# Patient Record
Sex: Male | Born: 1960 | Race: Asian | Hispanic: No | Marital: Married | Smoking: Never smoker
Health system: Southern US, Community
[De-identification: ages and names within clinical notes are randomized; demographics above are authoritative.]

## PROBLEM LIST (undated history)

## (undated) DIAGNOSIS — Z923 Personal history of irradiation: Secondary | ICD-10-CM

## (undated) DIAGNOSIS — I10 Essential (primary) hypertension: Secondary | ICD-10-CM

## (undated) DIAGNOSIS — M87059 Idiopathic aseptic necrosis of unspecified femur: Secondary | ICD-10-CM

## (undated) DIAGNOSIS — N289 Disorder of kidney and ureter, unspecified: Secondary | ICD-10-CM

## (undated) DIAGNOSIS — C029 Malignant neoplasm of tongue, unspecified: Secondary | ICD-10-CM

## (undated) HISTORY — DX: Essential (primary) hypertension: I10

## (undated) HISTORY — PX: JOINT REPLACEMENT: SHX530

## (undated) HISTORY — PX: OTHER SURGICAL HISTORY: SHX169

## (undated) HISTORY — DX: Personal history of irradiation: Z92.3

## (undated) HISTORY — PX: HIP SURGERY: SHX245

## (undated) HISTORY — PX: TOTAL HIP ARTHROPLASTY: SHX124

---

## 1997-10-09 ENCOUNTER — Ambulatory Visit (HOSPITAL_COMMUNITY): Admission: RE | Admit: 1997-10-09 | Discharge: 1997-10-09 | Payer: Self-pay | Admitting: General Surgery

## 1997-12-08 ENCOUNTER — Ambulatory Visit (HOSPITAL_COMMUNITY): Admission: RE | Admit: 1997-12-08 | Discharge: 1997-12-08 | Payer: Self-pay | Admitting: General Surgery

## 1998-01-10 ENCOUNTER — Ambulatory Visit (HOSPITAL_COMMUNITY): Admission: RE | Admit: 1998-01-10 | Discharge: 1998-01-10 | Payer: Self-pay | Admitting: General Surgery

## 1998-11-05 HISTORY — PX: KIDNEY TRANSPLANT: SHX239

## 2000-09-17 ENCOUNTER — Encounter: Payer: Self-pay | Admitting: Nephrology

## 2000-09-17 ENCOUNTER — Emergency Department (HOSPITAL_COMMUNITY): Admission: EM | Admit: 2000-09-17 | Discharge: 2000-09-17 | Payer: Self-pay | Admitting: Emergency Medicine

## 2000-10-11 ENCOUNTER — Emergency Department (HOSPITAL_COMMUNITY): Admission: EM | Admit: 2000-10-11 | Discharge: 2000-10-11 | Payer: Self-pay | Admitting: Emergency Medicine

## 2000-10-11 ENCOUNTER — Encounter: Payer: Self-pay | Admitting: Emergency Medicine

## 2001-03-22 ENCOUNTER — Encounter: Payer: Self-pay | Admitting: Nephrology

## 2001-03-22 ENCOUNTER — Inpatient Hospital Stay (HOSPITAL_COMMUNITY): Admission: EM | Admit: 2001-03-22 | Discharge: 2001-03-23 | Payer: Self-pay | Admitting: Emergency Medicine

## 2001-04-06 ENCOUNTER — Encounter: Admission: RE | Admit: 2001-04-06 | Discharge: 2001-04-06 | Payer: Self-pay | Admitting: Nephrology

## 2001-04-06 ENCOUNTER — Encounter: Payer: Self-pay | Admitting: Nephrology

## 2001-04-22 ENCOUNTER — Encounter: Admission: RE | Admit: 2001-04-22 | Discharge: 2001-06-07 | Payer: Self-pay | Admitting: *Deleted

## 2002-02-02 ENCOUNTER — Encounter: Payer: Self-pay | Admitting: Orthopedic Surgery

## 2002-02-02 ENCOUNTER — Ambulatory Visit (HOSPITAL_COMMUNITY): Admission: RE | Admit: 2002-02-02 | Discharge: 2002-02-02 | Payer: Self-pay | Admitting: Orthopedic Surgery

## 2003-01-25 ENCOUNTER — Encounter: Payer: Self-pay | Admitting: Nephrology

## 2003-01-25 ENCOUNTER — Encounter: Admission: RE | Admit: 2003-01-25 | Discharge: 2003-01-25 | Payer: Self-pay | Admitting: Nephrology

## 2003-12-15 ENCOUNTER — Inpatient Hospital Stay (HOSPITAL_COMMUNITY): Admission: RE | Admit: 2003-12-15 | Discharge: 2003-12-18 | Payer: Self-pay | Admitting: Orthopedic Surgery

## 2004-09-04 ENCOUNTER — Encounter: Admission: RE | Admit: 2004-09-04 | Discharge: 2004-09-04 | Payer: Self-pay | Admitting: Nephrology

## 2004-12-03 ENCOUNTER — Inpatient Hospital Stay (HOSPITAL_COMMUNITY): Admission: RE | Admit: 2004-12-03 | Discharge: 2004-12-05 | Payer: Self-pay | Admitting: Orthopedic Surgery

## 2005-07-14 ENCOUNTER — Ambulatory Visit: Payer: Self-pay | Admitting: Internal Medicine

## 2005-07-18 ENCOUNTER — Ambulatory Visit: Payer: Self-pay | Admitting: Internal Medicine

## 2009-09-09 ENCOUNTER — Emergency Department (HOSPITAL_COMMUNITY): Admission: EM | Admit: 2009-09-09 | Discharge: 2009-09-09 | Payer: Self-pay | Admitting: Family Medicine

## 2010-02-21 ENCOUNTER — Emergency Department (HOSPITAL_COMMUNITY): Admission: EM | Admit: 2010-02-21 | Discharge: 2010-02-21 | Payer: Self-pay | Admitting: Family Medicine

## 2010-08-31 ENCOUNTER — Inpatient Hospital Stay (INDEPENDENT_AMBULATORY_CARE_PROVIDER_SITE_OTHER)
Admission: RE | Admit: 2010-08-31 | Discharge: 2010-08-31 | Disposition: A | Discharge status: Home / Self Care | Payer: 59 | Source: Ambulatory Visit | Attending: Family Medicine | Admitting: Family Medicine

## 2010-08-31 DIAGNOSIS — T783XXA Angioneurotic edema, initial encounter: Secondary | ICD-10-CM

## 2010-11-22 NOTE — Op Note (Signed)
NAMETEION, BALLIN                            ACCOUNT NO.:  [id number]   MEDICAL RECORD NO.:  [id number]                   PATIENT TYPE:  INP   LOCATION:  0002                                 FACILITY:  [facility]   PHYSICIAN:  Madlyn Frankel. Charlann Boxer, M.D.               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   POSTOPERATIVE DIAGNOSIS:  Left hip end-stage avascular necrosis with  collapse of femoral head, status post free vascularized fibula graft.   DESCRIPTION OF PROCEDURE:  1. Removal of hardware.  2. Conversion of previous hip surgery to total hip replacement.  271.32.   SURGEON:  Madlyn Frankel. Charlann Boxer, M.D.   COMPONENTS USED:  A DePuy hip system with a size 54 Pinnacle cup, a 54/36  neutral liner, and S-ROM 20 x 15 femoral stem plus 36+12 offset with a 36+0  ball, a 11F XXL sleeve.   ASSISTANT:  Clarene Reamer, P.A.-C.   ANESTHESIA:  General.   ESTIMATED BLOOD LOSS:  600 mL.   IV FLUIDS:  2200 mL.   DRAINS:  One.   COMPLICATIONS:  None apparent.   INDICATIONS FOR PROCEDURE:  Mr. Antonacci is a very pleasant 50 year old [place]  male who has a history of renal failure and subsequent renal transplant.  He, unfortunately, suffered avascular necrosis secondary to his medication.  He was seen and evaluated initially in [place] where he underwent bilateral  free vascular fibula grafts.  He has subsequently gone on to collapse of his  hips with persistent pain despite conservative measures.  We reviewed the  risks and benefits of the procedure and he consented for the above  procedure.   DESCRIPTION OF PROCEDURE:  The patient was brought to the operating theater.  Once adequate anesthesia and preoperative antibiotics, 1 g of Ancef, were  administered, the patient  was positioned in the right lateral decubitus  position, left side up.  Portions of the previous incision was utilized and  extended posteriorly for approach to the hip.  Iliotibial band and gluteus  maximus was incised in line with the incision.  Pins from the previous  surgery was identified and removed.  At this point a neck osteotomy was made  based off preoperative template and anatomical landmarks.  The femoral head  was removed and noted to be severely diseased.  The fibula strut allograft  present was identified.  Care was taken with the oscillating saw to prevent  fracture and make sure that bone cut had gone all the way through.  Following the neck osteotomy, exposure of the acetabulum was carried out.  The patient was noted to have significant contractions over the anterior  capsule.  A capsulotomy was performed.  The patient also had significant  anterior and posterior osteophytes.  With acetabulum exposure, reaming was  commenced with the 47 reamer.  It was carried to the medial wall.  Subsequent reaming was carried up to the 53 reamer with good purchase.  A 54  final Pinnacle cup was positioned with approximately 40 degrees of abduction  and 15 degrees of forward flexion.  This matched the position of his ischium  posteriorly.  Two cancellous bone screws were placed given the position it  was near anatomic.  Trial liner was placed and osteophytes were removed off  the anterior and posterior aspect of this pelvis.  This debrided further  capsule which helped free up the anterior aspect of the hip.   At this point attention was directed to femoral component.  The fibular  strut needed to be burred down with a high-speed bur.  This was carried out  down the shaft of the femur as well as laterally to prevent any varus  positioning on the component or any further complications or fractures.  Once this was carried out, preparation of the femoral canal was carried out  per protocol  for an S-ROM with a good purchase of cortical bone distally  with a 15 mm ring.  A 15.5 ring was passed down three-quarters of the way.  Proximal reaming was carried up to a 45F and then milling to a XXL.  Note  that the patient's head had collapsed in a significant amount of varus.  In  order to maintain or to maximize the length of the hip to a more anatomic  position, we based everything off the tip of the trochanter.  From this  standpoint, the sleeve ended up sitting up proud, about 2 mm which provided  [place] with our goals.  When the trial sleeve in place, trial reduction was  carried out initially with 36+8 stem, 20 x 15 with 10 degrees of  anteversion.  There seemed to be some impingement anteriorly.  Based on  this, we decided to try a 36+12 offset stem which provided a much more  clearance anterior.  Further debridement of capsular tissue was removed on  the anterior superior aspect of the pelvis.  With this in place, the hip was  stable with 36+ 0 ball.  The left lower extremity at this point seemed to be  matched to or a little longer than the right side which was discussed  preoperatively as the left lower extremity was shorter.  We also discussed  the fact that given the fact that both hips were affected and the right hip  will need to be done, we will be able to match his leg lengths with this  right hip replacement.  This was understood preoperatively.  The hip was  stable with internal rotation to 80 degrees, neutral abduction, 80 degrees  of flexion.  Hip was stable with excellent rotation and extension.  The hip  was stable and the sleeve positioned.  Note that the hip did adduct down to  the contralateral limb and sat in excellent rotative position without any  assistance.  At this point trial components were removed.  Final acetabular  shell hubcap was placed followed by the final alignment.  This was impacted without complication.  Final components with the femoral side  were brought  to the field with a 20 XXL sleeve was impacted into the level where the  trial was and the final 20-15-36+12 S-ROM stem was placed in  approximately  at 10 and 20 degrees of anteversion.  This amounted to an addition of 20-25  degrees of femoral anteversion, gave [place] a combined anteversion of 45  degrees.  A trial was carried out again with a 36+ 0 ball to assure  stability of the hip before final head was placed and the hip stability  matched the trial reduction with the same parameters.  The final 36+ 0 ball  was then impacted onto a __________ trunnion and the hip reduced.  The hip  was copiously irrigated with normal saline solution at this point.  The  posterior capsular tissue was reapproximated to itself as it was saved from  the short external rotators.  Hemovac drain was placed.  The iliotibial band  was reapproximated with #1Ethibond.  The gluteus maximus fascia with a  running #1 Vicryl.  The remainder of the wound was closed in layers with a  running 4-0 Monocryl.  The patient's hip was cleaned, dried and dressed  sterilely Steri-Strips, dressing, sponges and tape.  The patient was  transferred to the recovery room, extubated and in stable condition.                                               Madlyn Frankel Charlann Boxer, M.D.    MDO/MEDQ  D:  12/15/2003  T:  12/15/2003  Job:  [id number]

## 2010-11-22 NOTE — H&P (Signed)
[place]. [facility]  Patient:    [name], [name] Visit Number: [id number] MRN: [id number]          Service Type: EMS Location: [facility] [place] Attending Physician:  Nelia Shi Dictated by:   Pricilla Holm, M.D. Admit Date:  03/21/2001   CC:         [facility]   History and Physical  SERVICE:  Renal service.  PRIMARY CARE PHYSICIAN:  Dyke Maes, M.D.  CHIEF COMPLAINT:  Fever, nausea and vomiting.  HISTORY OF PRESENT ILLNESS:  The patient is a 50 year old male status post renal transplant in May 2000 secondary to end-stage renal disease who recently underwent vascular grafting on his right leg several weeks ago.  He was on Cipro x 10 days ago and developed a low grade fever about four days ago.  He went to [facility] for urine and lab work and was put on Cipro at that time.  Today, he was changed to Augmentin.  He reports nausea and vomiting x 2 today with a fever up to 104 and diarrhea x 1.  He reports increased weakness.  No sick contacts.  No chest pain, no shortness of breath. Positive headache.  No dysuria. No hematuria.  No melena.  He has been taking all of his medications as prescribed.  PAST MEDICAL HISTORY: 1. End-stage renal disease, status post renal transplantation on the right by    Dr. Cameron Ali at [facility] in May 2000.  The patient did have a vascular    necrosis associated with that transplantation.  The patient was on dialysis    or three years, originally hemodialysis and then peritoneal dialysis. 2. Hypertension. 3. Status post vascular grafting on the right by Dr. Dorothea Ogle.  MEDICATIONS: 1. Tylenol EX p.r.n. 2. Aspirin q.d. 3. Prednisone 2.5 mg q.d. 4. Altace 2.5 mg q.d. 5. CellCept 1 g b.i.d. 6. Gengraf 125 mg b.i.d. 7. Multivitamin q.d. 8. Citracal with D q.a.c.  ALLERGIES:  VANCOMYCIN - red mans syndrome.  FAMILY HISTORY:  No history of renal problems in family.  SOCIAL HISTORY:   Married.  Lives in [place] where he has been living for the past eight years.  He has a 10 year old son.  He is self employed.  No alcohol.  No tobacco.  REVIEW OF SYSTEMS:  Per HPI.  PHYSICAL EXAMINATION:  VITAL SIGNS:  Temperature 102.9, respirations 18, pulse 105, blood pressure 114/64.  Saturation 96% on room air.  GENERAL:  This is a well-developed, well-nourished pleasant 50 year old male in mild distress secondary to vomiting.  HEENT:  Normocephalic, atraumatic.  Pupils are equal, round and reactive to light and accommodation.  Extraocular movements intact bilaterally.  Nares patent without discharge.  Oropharynx pale with moist mucous membranes.  No tonsillar exudate or erythema.  NECK:  Supple without lymphadenopathy.  No JVD.  HEART:  Regular rate and rhythm without murmurs, rubs or gallops.  LUNGS:  Clear to auscultation bilaterally with good effort.  ABDOMEN:  Soft, nontender, nondistended.  Hyperactive bowel sounds. Well-healed scars.  Fullness in the right lower quadrant secondary to kidney transplant.  EXTREMITIES:  No cyanosis, clubbing or edema.  Warm to touch.  Two linear scars on right leg without evidence of erythema or discharge.  NEUROLOGICAL: Cranial nerves II-XII grossly intact.  Alert and oriented x 4.  LABORATORY DATA:  Blood cultures x 2 pending.  Sodium 133, potassium 4.4, chloride 104, bicarbonate 24, BUN 16, creatinine 1.5, glucose 115, calcium 8.6, total protein 7.1, albumin  3.0, AST 18, ALT 18.  Alkaline phosphatase 79, total bilirubin 1.2.  White blood cell count 13.7, hemoglobin 9.4, hematocrit 27.8.  Platelets 460,000.  MCV 86.9, ANC 11.5.  Urinalysis:  Specific gravity of 1.012, trace hemoglobin, nitrite negative, leukocyte esterase negative.  Microscopic reveals a few epithelial cells, 3-6 white blood cells, 3-6 red blood cells and a few bacteria.  ASSESSMENT AND PLAN: 1. [age]-year-old male status post renal transplant. Recent  vascular    rafting now with fever, nausea and vomiting.  Admit to renal service,    Dr. Lowell Guitar, attending, 5500. 2. Fever.  The patient has fever with an elevated white blood cell.  Will    obtain chest x-ray, blood cultures x 2, culture of urine despite being on    antibiotics.  Will send stool for culture and Clostridium difficile.    Renal ultrasound for hydronephrosis.  Will start broad spectrum    antibiotics specifically Tequin and Zosyn.  We will also check a CMV    titer. 3. Status post transplant.  Continue CellCept and Gengraf. 4. Fluids, electrolytes and nutrition.  Renal diet when able to    tolerate.  May need to give IV fluids p.r.n. secondary to nausea    and vomiting. 5. Hypertension.  Continue Altace. Dictated by:   Pricilla Holm, M.D. Attending Physician:  Nelia Shi DD:  03/22/01 TD:  03/22/01 Job: [id number]

## 2010-11-22 NOTE — H&P (Signed)
NAMEHUSAIN, COSTABILE                ACCOUNT NO.:  [id number]   MEDICAL RECORD NO.:  [id number]          PATIENT TYPE:  INP   LOCATION:  NA                           FACILITY:  [facility]   PHYSICIAN:  Madlyn Frankel. Charlann Boxer, M.D.  DATE OF BIRTH:  1961/04/10   DATE OF ADMISSION:  12/03/2004  DATE OF DISCHARGE:                                HISTORY & PHYSICAL   CHIEF COMPLAINT:  Right hip avascular necrosis, status  post prevascular  fibular grafting with collapse and failure.   HISTORY OF PRESENT ILLNESS:  Mr. Barrows is a very pleasant 50 year old  gentleman who is known to my practice after being referred from Dr. Primitivo Gauze of [facility] regarding his hips.  He has a  history of renal transplantation due to prednisone use, presumably present  in use of bilateral avascular necrosis.  He subsequently underwent free  vascularized fibular grafting at [facility].  These were initially done in 2002 on  the left and 2003 on the right.  He unfortunately went on to have failure of  these with femoral head collapse and degenerative joint changes, resulting  in excessive loss of motion, pain with activities.  In June of 2005 he  underwent a left hip surgery to convert previous hip surgery to a left total  hip replacement.  He has done extremely well with this.  During the  postoperative period we discussed with him when to perform this right hip  surgery.  At this point he is ready.  He has had no problems with fevers,  chills, night sweats.  No  problems with infections throughout this  postoperative recovery period.   PAST MEDICAL HISTORY:  1.  End-stage renal failure, status post cadaveric renal transplant.  2.  History of hypertension.  3.  History of hypercholesterolemia.  4.  Avascular necrosis, bilateral hips.   PAST SURGICAL HISTORY:  1.  Renal transplant.  2.  Bilateral prevascularized fibula grafting noted above.  3.  He is also status post left total hip replacement  performed in June of      2005.   MEDICATIONS:  1.  Prednisone 2.5 mg p.o. daily.  2.  CellCept 1000 mg p.o. b.i.d.  3.  Gengraf 100 mg p.o. b.i.d.  4.  Citrucel one p.o. b.i.d.  5.  Altace 2.5 mg b.i.d.  6.  Lipitor 10 mg p.o. daily.   ALLERGIES:  VANCOMYCIN causes redman.   SOCIAL HISTORY:  He denies any tobacco use, has occasional alcohol.  He is  married.   FAMILY HISTORY:  Hypertension.   REVIEW OF SYSTEMS:  Otherwise unremarkable.  He did have some recent onset  of some left-sided buttock discomfort in which he was concerned about his  hip prosthesis after some activity, but I felt this was more muscular  strain.  This was a recent office visit, but there were no abnormalities  present.   PHYSICAL EXAMINATION:  GENERAL:  Reveals a healthy male in no acute  distress.  In general he is awake, alert, oriented, and cooperative.  Well-  dressed and appropriate, eager to  proceed.  VITAL SIGNS:  His temperature was 98.2, pulse 68, blood pressure 144/80.  HEENT/NECK:  Completely normal with no palpable nodes.  He has no bruits  detected.  CHEST:  Clear to auscultation bilaterally with no rales or rhonchi.  HEART:  Has regular rate and rhythm, no murmur.  ABDOMEN:  Soft, nontender, with positive bowel sounds.  EXTREMITIES:  Left lower extremity reveals that he has excellent range of  motion without pain in the left hip.  He has negative straight leg raising,  normal deep tendon reflexes.  Examination of the right hip reveals external  rotation contracture and discomfort with internal rotation barely getting to  neutral.  He has no discomfort laterally.  He is otherwise motor and sensory  intact.  His right lower extremity appears slightly shorter than the left  related to a normalization of the left hip with subsequent collapse of his  right femoral head.  Radiographs reveal that AP pelvis indicates a status  post left total hip replacement with right-sided femoral head  collapse,  status post fre vascularized fibular graft.   IMPRESSION:  As above.   PLAN:  As reviewed in our office Mr. Wenberg will be admitted for same day  surgery on Dec 03, 2004, at which point he will undergo conversion of  previous right hip surgery to right total hip replacement.  We will plan on  using a DePuy hip system with an S-ROM component for stability to match his  contralateral side.  Will base this off the tip of the trochanter as a  measurement off the computer.  We noted a 30 mm neck code as a tip of the  aspect of the lesser trochanter, but tip of the trochanter equaled the  center of the head.  We also discussed using an ASR implant if his bone  quality appears sufficient with no evidence of problems of pathology  present.   He will have a Foley in place and we will remove this on postoperative day  #1 per our routine at this point.      MDO/MEDQ  D:  11/30/2004  T:  11/30/2004  Job:  [id number]

## 2010-11-22 NOTE — Discharge Summary (Signed)
[place]. [facility]  Patient:    [name], [name] Visit Number: [id number] MRN: [id number]          Service Type: MED Location: [id number] Attending Physician:  Hans Eden Dictated by:   Pricilla Holm, M.D. Admit Date:  03/21/2001 Discharge Date: 03/23/2001   CC:         Dyke Maes, M.D., primary physician, [facility]                           Discharge Summary  DISCHARGE DIAGNOSES: 1. Fever, nausea and vomiting of unknown origin. 2. End-stage renal disease status post renal transplant in May of 2000. 3. Hypertension. 4. Status post vascular grafting right leg.  DISCHARGE MEDICATIONS: 1. Tequin 200 mg one tablet p.o. q.d. x 7 days. 2. Aspirin 50 year old one tablet p.o. q.d. 3. Prednisone 2.5 mg one tablet p.o. q.d. 4. Altace 2.5 mg one tablet p.o. q.d. 5. CellCept 1 g b.i.d. 6. GenGraft 125 mg one tablet b.i.d. 7. Multivitamin one tablet p.o. q.d. 8. Citracal with D one tablet p.o. q.a.c.  SUMMARY:  Please refer to the admission H&P for more detailed summary. Briefly, this is a 50 year old male status post renal transplant in May of 2000, secondary to end-stage renal disease who recently underwent vascular grafting on the right several weeks ago.  He had been on Cipro for 10 days following that procedure.  He was off medications for approximately four days and developed low grade fever.  He went to [place] at which time a urine culture and lab work was obtained and he was put on Cipro.  He was changed to Augmentin on the day of admission secondary to fever and nausea and vomiting.  The fever was as high as 104 at home and he continued to have nausea, vomiting, and some diarrhea with weakness and came to the ED to be evaluated. The patient was admitted to the renal service for further work-up.  HOSPITAL COURSE:  #1 - FEVER, NAUSEA AND VOMITING:  The patient had a temperature of 102.9  on admission and was actively vomiting at that time.  He was admitted to the renal service and given IV fluids at 75 cc per hour for fluid resuscitation. He also had a UA that was obtained which showed a specific gravity of 1.012, trace hemoglobin, negative nitrites, negative leukocyte esterase.  Micro revealed few epithelial cells, 3 to 6 white blood cells, 3 to 6 red blood cells and few bacteria.  White blood cell count was 13.7 with ANC of 11.5. The patient was started on Tequin and Zosyn for broad coverage.  The lab was contacted about his previous UA that was done on March 17, 2001. The culture had insignificant growth and was considered to be secondary to contamination.  As an outpatient, his urine had moderate leukocyte esterase with 11 to 20 white blood cells and few bacteria, hence the commencement of Cipro.  His culture on his urine upon admission to [facility] again had insignificant growth.  The patient defervesced and was afebrile x 36 hours prior to discharge.  He stated that he felt much better and was tolerating p.o.s without problem.  He had no nausea and vomiting for greater than 36 hours as well.  Upon further discussion he reports a coworker who had a gastroenteritis who he had spent a considerable amount of time with at the end of  last week.  Again, he will be continued on the Tequin for another seven days and will follow up with Dyke Maes, M.D., as an outpatient.  #2 - STATUS POST RENAL TRANSPLANT:  The patient will continue with CellCept and GenGraft and those medicines were continued during his hospitalization.  #3 - HYPERTENSION:  The patient will continue Altace.  CONDITION ON DISCHARGE:  Good.  DISPOSITION:  Discharged home with family.  MEDICATIONS:  As per above.  INSTRUCTIONS:  The patient was given no particular activity restrictions or diet restrictions.  He is to keep his incision site clean and dry.  FOLLOW-UP:   He will follow up with Dr. Briant Cedar within the next week and they will be calling for an appointment.  He voiced agreement and understanding of the above plan and had no further questions. Dictated by:   Pricilla Holm, M.D. Attending Physician:  Hans Eden DD:  03/23/01 TD:  03/23/01 Job: [id number]

## 2010-11-22 NOTE — Op Note (Signed)
NAMECORNEY, KNIGHTON                ACCOUNT NO.:  [id number]   MEDICAL RECORD NO.:  [id number]          PATIENT TYPE:  INP   LOCATION:  0006                         FACILITY:  [facility]   PHYSICIAN:  Madlyn Frankel. Charlann Boxer, M.D.  DATE OF BIRTH:  10/23/1960   DATE OF PROCEDURE:  12/03/2004  DATE OF DISCHARGE:                                 OPERATIVE REPORT   PREOPERATIVE DIAGNOSIS:  Right hip avascular necrosis, status post a failed  free-vascularized tibial graft.   POSTOPERATIVE DIAGNOSIS:  Right hip avascular necrosis, status post a failed  free-vascularized tibial graft.   PROCEDURE:  Conversion of previous right hip surgery to a right total hip  replacement.   COMPONENTS USED:  DePuy hip system with a size 54, ASR acetabular shell.  The S/ROM 20 x 15 36+12 stem with a 24F large sleeve and a 47 ASR ball with  a neutral 0 mm adaptor.   SURGEON:  Madlyn Frankel. Charlann Boxer, M.D.   ASSISTANT:  Georges Lynch. Darrelyn Hillock, M.D.   ANESTHESIA:  General.   BLOOD LOSS:  600.   DRAINS:  One.   COMPLICATIONS:  None apparent.   INDICATIONS FOR PROCEDURE:  Mr. Derks is a pleasant 50 year old gentleman  who has a history of a renal transplant following excessive prednisone use,  per his medical chart.  This subsequently led to the development of  avascular necrosis of bilateral femoral heads in approximately 2002.  In  2003, he underwent bilateral free-vascularized tibial grafting.  He  presented to the office last year after developing progressively worse pain  and loss of motion in his hip.  In June, 2005, he underwent conversion of  his left free-vascularized fibular grafting to a left total hip replacement.  He has done real well with this.  At this point, he is ready to proceed with  right conversion.  Risks and benefits were reviewed.  We discussed  dislocation, as he had some concern with that.  I discussed with him the  newer technology, ASR, larger head size, and decreased wear potential, based  on  fair wear simulators.  It will also decrease the risk of a dislocation.  After reviewing these risks and benefits and discussing these options, he  wished to proceed with the metal-on-metal ASR technology.   PROCEDURE IN DETAIL:  Patient was brought to the operating theater.  Once  adequate anesthesia and preop antibiotics with 1 gm Ancef were administered,  the patient was positioned in the left lateral decubitus position with the  right side up.  The patient's previous anterior curvilinear incision was  identified but not utilized, based on its anterior positioning.  A standard  curvilinear incision lateral-based incision was made for posterior approach  of the hip.  The iliotibial band was excised in order to expose the pins  transfixing the fibular from the previous surgery.  This was removed without  difficulty.  At this point, a standard hip exposure was obtained, taking  down the short external rotators except from the posterior capsule, which  was saved for later repair.  The patient was noted to have  a very hyperemic  and bloody effusion with hyperemic synovium present.  Once the hip was  exposed, the hip was dislocated.  The femoral head was noted to have the  overlying cartilage of the femoral head completely avulsed off of the  femoral head with an exposed fibula present.  At this point, the neck  osteotomy was made.  This was made off the anatomical landmarks and  preoperative templating.  Two separate landmarks, one primarily used for the  S/ROM hip system with the tip of the trochanter on the contralateral side  was utilized, as this was where the center of the head was from the previous  surgery.  Based on this, this is where the templated cut was made.  Following this neck osteotomy, a removal of severe osteophytes over the  anterior neck and medial neck.  The femur was rotated and retractors placed  to expose the acetabulum.  Once acetabular exposure was obtained,  including  labrectomy, reaming commenced with a 47 reamer.  This patient has  significant osteophytes, both posterior and anterior.  Reaming was carried  up to a 53 reamer, which gave me good purchase and good bleeding bone.  I  noted landmarks in preparation of impacting the 54 and ASR cup.  This was  impacted with final position of about 15-20 degrees of forward flexion and  40 degrees of abduction.  The cup was excellently secured and then marched  down to the landmarks previously identified.  At this point, osteophytes  were removed off the anterior and posterior aspect of the hip.  Further  debridement was necessary was needed.  Following this, attention was  directed to the femur.   Femoral preparation was carried out per protocol for an S/ROM hip system.  Note that a high-speed bur was utilized to bur down fibular bone, primarily  laterally to prevent varus positioning of the component.  This was done  several different times during reaming.  I also used it to prepare the bed  for milling in the neck.  I carried down a reamer to excellent bone purchase  with a 15 distally.  The 15.5 was carried 3/4 of the way down.  At this  point, a 15BDF reaming was carried out proximally, followed by milling to a  large.  This gave me excellent bony contact.  A trial 37F large sleeve was  then impacted to a level neck cut.  Note that the proximal femur was all  prepared using the tip of the bony trochanter as a landmark to match the  contralateral limb.  At this point, a trial sleeve and stem were placed, and  a 47 ASR ball placed.  The hip was reduced.  At this point, the leg lengths  appeared equal, based on the inferior pole of the patella bilaterally in the  neutral position.   With this information and the stability of the hip in a trial range of  motion, which was extremely stable and particularly in external rotation and extension, the sleeve position, which is about 30-40 degrees of  adduction  and internal rotation as well as with neutral abduction, forward flexion of  90 degrees, and internal rotation to 80 degrees.  Given these parameters,  final components were brought into the field.  I felt confident the neck cut  matched the tip of the trochanter, which allowed me then to match the leg  lengths.  Final components were brought into the field.  Final components  were placed without complication, including a 47 ASR ball with a neutral of  0 adaptor.  The hip was then reduced and stable.  The hip was copiously  irrigated throughout the procedure and again at this point, the posterior  capsule was reapproximated with #1 Ethibond.  Further debridement was  necessary was carried out, followed by placement of a medium Hemovac deep.  The remainder of the wound was closed in layers with #1 Ethibond on the  iliotibial band, #1 Vicryl in the gluteus maximus fascia.  A 4-0 Monocryl  was used on the skin.  The hip was clean, dried, and dressed sterilely.  Steri-Strips and dressing sponges and tape.  The patient was then extubated  and transferred to the recovery room in stable condition.      MDO/MEDQ  D:  12/03/2004  T:  12/03/2004  Job:  [id number]

## 2010-11-22 NOTE — Discharge Summary (Signed)
[name], [name]                ACCOUNT NO.:  [id number]   MEDICAL RECORD NO.:  [id number]          PATIENT TYPE:  INP   LOCATION:  1505                         FACILITY:  [facility]   PHYSICIAN:  [name], M.D.  DATE OF BIRTH:  1961/06/01   DATE OF ADMISSION:  12/03/2004  DATE OF DISCHARGE:  12/05/2004                                 DISCHARGE SUMMARY   ADMITTING DIAGNOSES:  1.  Avascular necrosis with collapse of right femoral head.  2.  End-stage renal failure.  3.  Hypertension.  4.  Hypercholesterolemia.  5.  Avascular necrosis, bilateral hips   DISCHARGE DIAGNOSES:  1.  Avascular necrosis with collapse of right femoral head.  2.  End-stage renal failure.  3.  Hypertension.  4.  Hypercholesterolemia.  5.  Avascular necrosis, bilateral hips  6.  Mild postoperative anemia.   OPERATION:  On Dec 03, 2004 the patient underwent conversion of previous  right hip surgery to right total hip replacement arthroplasty.   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 He developed  bilateral femoral head avascular necrosis. A free vascularized tibial  grafting was performed in 2003. He had worse pain, decreased motion into the  right hip. He actually has had a free vascularized fibular graft to the left  hip and done well with that, but the right hip is markedly interfering with  his day-to-day activities. After risks and benefits of surgery were  described to the patient, we decided to go ahead with the above procedure.   COURSE IN THE HOSPITAL:  The patient tolerated the surgical procedure quite  well. He was placed on Coumadin protocol postoperatively for prevention of  DVT. He did have a mild postoperative anemia. He was not symptomatic,  however. We watched his medical status very carefully during the  hospitalization. No critical incidents occurred. On the day of discharge the  patient's  wound was clean and dry, we allowed partial weightbearing to right  lower extremity of about 50%, and to continue so about 4 weeks after  surgery. He has good spirits and anxious to go home.   LABORATORY VALUES IN THE HOSPITAL:  Hematologically, showed a preoperative  CBC with a mild anemia. The hemoglobin was 11.8, hematocrit 35.7. Final  hemoglobin was 9.1 with hematocrit of 27.0. Blood chemistries were normal.  Creatinine was also normal. Urinalysis negative for urinary tract infection.  Electrocardiogram showed normal sinus rhythm and the chest x-ray showed no  active disease.   CONDITION ON DISCHARGE:  Improved, stable.   PLAN:  The patient is discharged to his home in the care of his family with  home health. He will continue with Coumadin protocol 4 weeks after date of  surgery, Colace used as a stool softener, Robaxin as muscle relaxant,  Vicodin for discomfort. Continue with partial weightbearing right lower  extremity about 50% for at least 4 weeks after surgery, may shower on the  fifth postoperative day. Continue home medications and diet. We have urged  him to call should he have  any problems and follow with his nephrologist as  indicated.       DLU/MEDQ  D:  12/16/2004  T:  12/16/2004  Job:  [id number]   cc:   Dyke Maes, M.D.  [address]  [place]  [place] [address]  Fax: [phone]

## 2010-11-22 NOTE — Discharge Summary (Signed)
[name], [name]                            ACCOUNT NO.:  [id number]   MEDICAL RECORD NO.:  [id number]                   PATIENT TYPE:  INP   LOCATION:  0462                                 FACILITY:  [facility]   PHYSICIAN:  Madlyn Frankel. Charlann Boxer, M.D.               DATE OF BIRTH:  December 26, 1960   DATE OF ADMISSION:  12/15/2003  DATE OF DISCHARGE:  12/18/2003                                 DISCHARGE SUMMARY   ADMISSION DIAGNOSES:  1. Bilateral hip avascular necrosis, status post free vascular fibular     grafting with advancement of disease, left greater than right.  2. End-stage renal disease.  3. Hypertension.  4. Hypercholesterolemia.  5. History of cadaveric renal transplant.   DISCHARGE DIAGNOSES:  1. Bilateral hip avascular necrosis, status post free vascular fibular     grafting with advancement of disease, left greater than right, status     post left total hip arthroplasty.  2. End-stage renal disease.  3. Hypertension.  4. Hypercholesterolemia.  5. History of cadaveric renal transplant.  6. Postoperative hemorrhagic anemia, stable at time of discharge.   PROCEDURES:  The patient was taken to the operating room on December 15, 2003,  and underwent removal of hardware and left total hip arthroplasty.  Surgeon  was Madlyn Frankel. Charlann Boxer, M.D.  Assistant was [employer], P.A.-C.  Surgery  was done under general anesthesia, and Hemovac drain x1 was placed at the  time of surgery.   CONSULTATIONS:  Physical therapy, occupation therapy, social work, case  Insurance account manager.   BRIEF HISTORY:  The patient is a 50 year old male with a history of end-  stage renal disease and subsequently underwent cadaveric renal transplant.  Due to that, he was placed on long-term course of steroids.  He then  developed bilateral AVN in his hip.  He then underwent free vascular  arterial grafting to bilateral hips.  He now has had increased amount of  pain in his left hip with decreased range of motion.  He  lacked 15 degrees  in internal rotation, external rotation 40 degrees, and only able to flex to  80 degrees, and he walks with a very stiff, wobbling type gait.  X-rays  revealed history of free vascular fibular grafting with intact hardware and  with advanced collapse of femoral head around graft on left greater than  right side.  On x-ray findings, Dr. Charlann Boxer felt it was best to proceed with a  left total hip arthroplasty.  The patient agreed.  Risks, benefits of  surgery were discussed with the patient, and patient wished to proceed.   LABORATORY AND X-RAY DATA:  CBC on admission showed a hemoglobin of 12.2,  hematocrit 36.4, white blood cell count 6.2, red blood cell count 3.99.  Serial hemoglobin and hematocrit followed throughout hospital stay.  Hemoglobin and hematocrit did decline to 8.6 and 25.5, respectively, and at  time of discharge were  stable.  Differential on admission was all within  normal limits.  Coagulation studies on admission were all within normal  limits.  PT and INR at time of discharge were 14.9 and 1.3, respectively, on  Coumadin therapy.  Serial chemistries were also followed throughout hospital  stay.  Chemistries on admission showed glucose high at 109.  Followup  chemistries showed glucose remained high at 119 and then returned into the  normal range on June 12 at 90.  Sodium fell to low of 134 on June 12 but was  back up to normal range at 136 and stable at the time of discharge.  Urinalysis on admission was within normal limits.  The patient's blood type  is B positive with antibody screen negative.   EKG on admission showed normal sinus rhythm with septal infarct, age  undetermined.  Preop chest x-ray revealed no active disease.  Preop x-rays  of the left hip revealed avascular necrosis of the left femoral head with  severe sclerosis and deformity present.  Followup x-rays of the left hip  revealed interval left total hip replacement without apparent  complication.   HOSPITAL COURSE:  The patient was admitted to [facility] and taken  to the operating room.  He underwent the above-stated procedure without  complications.  The patient tolerated the procedure well, went to recovery  room and then orthopedic floor for continued postoperative care.   Hemovac drain placed at the time of surgery was discontinued on postop day  #1.  PCA was also discontinued on postop day #1 with anticipation of patient  possibly going home on postop day #2.  The patient's hemoglobin fell a  little low to 8.9 but was relatively stable.  He was also a little slow with  physical therapy.  Therefore, his discharge was held on postop day #2.  He  will have repeat discharge on postop day #3.  He is doing well this a.m.  He  has had a bowel movement.  His hemoglobin has fallen to 8.6; however, it is  stable.  The patient will be discharged home on this date.   DISPOSITION:  The patient is discharged home on December 18, 2003.   DISCHARGE MEDICATIONS:  1. Coumadin per pharmacy protocol.  2. Vicodin 1 to 2 p.o. q.4-6h. p.r.n. pain.  3. Robaxin 500 mg 1 p.o. q.6-8h. p.r.n. spasm.  4. Trinsicon caplets 1 p.o. t.i.d.  5. Colace 100 mg 1 p.o. b.i.d.   DIET:  As tolerated.   ACTIVITY:  The patient is total hip precautions.  He is partial  weightbearing to the left lower extremity.  He will have [employer] for home  care.   WOUND CARE:  The patient is to have daily dressing changes performed until  no drainage.  He may shower when no drainage.   FOLLOWUP:  The patient is to follow up with Dr. Charlann Boxer in 10 days.  He is to  call the office for appointment at [phone].   CONDITION ON DISCHARGE:  Stable and improved.     Clarene Reamer, P.A.-C.                   Madlyn Frankel Charlann Boxer, M.D.    SW/MEDQ  D:  12/18/2003  T:  12/18/2003  Job:  [id number]

## 2010-11-22 NOTE — H&P (Signed)
[name], [name]                            ACCOUNT NO.:  [id number]   MEDICAL RECORD NO.:  [id number]                   PATIENT TYPE:  INP   LOCATION:  0462                                 FACILITY:  [facility]   PHYSICIAN:  Madlyn Frankel. Charlann Boxer, M.D.               DATE OF BIRTH:  09-07-1960   DATE OF ADMISSION:  12/15/2003  DATE OF DISCHARGE:                                HISTORY & PHYSICAL   CHIEF COMPLAINT:  Left hip pain.   HISTORY OF PRESENT ILLNESS:  The patient is a 50 year old male who was sent  to Dr. Charlann Boxer by Dr. Primitivo Gauze at [facility] regarding  his hips.  The patient has a significant past medical history for end-stage  renal disease and is now status post cadaveric renal transplant which was  performed in May of 2000 at [facility].  The  patient has a longstanding history requiring prednisone and CellCept and  Gengraf medications for his transplant.  He had developed avascular necrosis  in both hips and was evaluated at [facility] by Dr.  Eulogio Bear who subsequently performed free vascularized fibular graft  procedures on the left hip in 2002 and on the right hip in 2003.  At this  point [name] has persistent complaints of groin and buttock-type pain  worse on the left than the right.  He states that some of the pain seems to  radiate into his knees. He currently remains on a very low dose of  prednisone at 2.5 mg.   His major complaints regarding the hip, at this point, are having some pain  and discomfort.  There is a loss of motion in the hips.  He walks with an  abnormal gait.  Because of this he has had concern about the condition of  his hips. He is worried that they have advanced along and he is trying to  avoid replacement surgery.  Radiographs in the office revealed a history of  free vascularized fibular grafting with intact hardware and with advanced  collapse of the femoral head around the  graft worse on the left than the  right.  Upon review of these findings Dr. Charlann Boxer feels that it is best to  proceed with a left total hip arthroplasty.  The patient agrees.  The risks  and benefits of the surgery were discussed with the patient and the patient  wishes to proceed.   PAST MEDICAL HISTORY:  1. End-stage renal failure.  2. Hypertension.  3. Hypercholesterolemia.  4. Avascular necrosis in bilateral hips.   PAST SURGICAL HISTORY:  Renal transplant and bilateral free vascular fibular  grafting to the hips.   MEDICATIONS:  1. Prednisone 2.5 mg 1 p.o. daily.  2. CellCept 1000 mg 1 p.o. b.i.d.  3. Gengraf 100 mg 1 p.o. b.i.d.  4. Citrucel 1 p.o. b.i.d.  5. Altace 2.5 mg 1  p.o. b.i.d.  6. Lipitor 10 mg 1 p.o. daily.   ALLERGIES:  Vancomycin causes Redman syndrome.   SOCIAL HISTORY:  The patient denies any tobacco use, has occasional alcohol  intake.  He is married.  His wife will be his caregiver after surgery.   FAMILY HISTORY:  Mother hypertension and father hypertension.   REVIEW OF SYSTEMS:  GENERAL:  Denies fever, chills or night sweats.  __________.  CNS:  Denies blurring, double vision, seizure, headaches,  paralysis.  RESPIRATORY:  Denies shortness of breath, cough, hemoptysis.  CV:  Denies chest pain, angina or orthopnea.  GI:  Denies nausea, vomiting,  diarrhea, constipation, or bloody stool.  GU:  Denies dysuria, hematuria, or  discharge.  MUSCULOSKELETAL:  As pertinent to HPI.   PHYSICAL EXAMINATION:  VITAL SIGNS:  Blood pressure 122/90, pulse 80,  respirations 12.  GENERAL:  A well-developed, well-nourished, 50 year old male.  HEENT:  Normocephalic, atraumatic.  Pupils are equal, round, and react to  light.  NECK:  Supple.  No carotid bruits noted.  CHEST:  Clear to auscultation bilaterally.  No wheezes or crackles.  HEART:  Regular rate and rhythm no murmurs, rubs or gallops.  ABDOMEN:  Soft, nontender, nondistended, positive bowel sounds x4.   EXTREMITIES:  Examination of extremities very stiff and waddling-type gait.  Examination of the left hip reveals significant reduced range of motion with  an external rotation contracture.  The patient has a tendency to fall into  an external rotation. He is very limited and blocked internal rotation  lacking almost 15 degrees in neutral internal rotation.  External rotation  is about to 40 degrees.  He is limited to flexion to about 80 degrees.  He  remains neurovascularly intact to the left lower extremity.  SKIN:  No rashes or lesions.   X-RAYS:  Reveal a history of free vascularized fibular grafting with intact  hardware and with advanced collapse of the femoral head around the graft  worse on the left than the right.   IMPRESSION:  1. Bilateral hip AVN status post free vascular fibular grafting with     advancement of disease.  2. End-stage renal disease.  3. Hypertension.  4. Hypercholesterolemia.   PLAN:  The patient will be admitted to the [facility] on  12/15/2003 to undergo a left total hip arthroplasty by Dr. Durene Romans.     Clarene Reamer, P.A.-C.                   Madlyn Frankel Charlann Boxer, M.D.    SW/MEDQ  D:  12/17/2003  T:  12/18/2003  Job:  [id number]   cc:   Dyke Maes, M.D.  [address]  [place]  [place] [address]  Fax: [phone]

## 2011-01-07 ENCOUNTER — Inpatient Hospital Stay (INDEPENDENT_AMBULATORY_CARE_PROVIDER_SITE_OTHER)
Admission: RE | Admit: 2011-01-07 | Discharge: 2011-01-07 | Disposition: A | Discharge status: Home / Self Care | Payer: 59 | Source: Ambulatory Visit | Attending: Family Medicine | Admitting: Family Medicine

## 2011-01-07 ENCOUNTER — Emergency Department (HOSPITAL_COMMUNITY): Payer: 59

## 2011-01-07 ENCOUNTER — Emergency Department (HOSPITAL_COMMUNITY)
Admission: EM | Admit: 2011-01-07 | Discharge: 2011-01-07 | Disposition: A | Discharge status: Home / Self Care | Payer: 59 | Attending: Emergency Medicine | Admitting: Emergency Medicine

## 2011-01-07 ENCOUNTER — Ambulatory Visit (INDEPENDENT_AMBULATORY_CARE_PROVIDER_SITE_OTHER): Payer: 59

## 2011-01-07 DIAGNOSIS — R109 Unspecified abdominal pain: Secondary | ICD-10-CM | POA: Insufficient documentation

## 2011-01-07 DIAGNOSIS — R10819 Abdominal tenderness, unspecified site: Secondary | ICD-10-CM

## 2011-01-07 DIAGNOSIS — I129 Hypertensive chronic kidney disease with stage 1 through stage 4 chronic kidney disease, or unspecified chronic kidney disease: Secondary | ICD-10-CM | POA: Insufficient documentation

## 2011-01-07 DIAGNOSIS — N189 Chronic kidney disease, unspecified: Secondary | ICD-10-CM | POA: Insufficient documentation

## 2011-01-07 DIAGNOSIS — Z940 Kidney transplant status: Secondary | ICD-10-CM | POA: Insufficient documentation

## 2011-01-07 DIAGNOSIS — R630 Anorexia: Secondary | ICD-10-CM | POA: Insufficient documentation

## 2011-01-07 LAB — OCCULT BLOOD, POC DEVICE: Fecal Occult Bld: NEGATIVE

## 2011-01-07 LAB — POCT URINALYSIS DIP (DEVICE)
Ketones, ur: NEGATIVE mg/dL
Leukocytes, UA: NEGATIVE
Specific Gravity, Urine: 1.025 (ref 1.005–1.030)
Urobilinogen, UA: 0.2 mg/dL (ref 0.0–1.0)
pH: 5.5 (ref 5.0–8.0)

## 2011-01-07 LAB — POCT I-STAT, CHEM 8
HCT: 38 % — ABNORMAL LOW (ref 39.0–52.0)
Sodium: 137 mEq/L (ref 135–145)

## 2014-05-23 HISTORY — PX: TRACHEOSTOMY: SUR1362

## 2014-05-23 HISTORY — PX: NECK DISSECTION: SUR422

## 2014-05-23 HISTORY — PX: PARTIAL GLOSSECTOMY: SHX2173

## 2014-05-23 HISTORY — PX: OTHER SURGICAL HISTORY: SHX169

## 2014-05-23 HISTORY — PX: SKIN GRAFT: SHX250

## 2014-06-13 ENCOUNTER — Telehealth: Payer: Self-pay | Admitting: *Deleted

## 2014-06-13 NOTE — Telephone Encounter (Signed)
Called pt to acknowledge his referral by Dr. Conley Canal, introduce myself as the oncology nurse navigator that works with Dr. Pablo Ledger.  I indicated that I would be joining him during his  appt with her on Thursday and tell him more about my role as a member of the Care Team when we meet.  He confirmed his understanding of [place] location, 0930 and 1000 appt.  I explained campus arrival procedure, process for [facility] arrival and registration in Radiation Oncology Waiting.  He verbalized understanding.  Gayleen Orem, RN, BSN, [place] at [place] [phone]

## 2014-06-14 ENCOUNTER — Encounter: Payer: Self-pay | Admitting: Radiation Oncology

## 2014-06-14 NOTE — Progress Notes (Signed)
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  Biopsies revealed:   05/23/14    Nutrition Status Yes No Comments  Weight changes? []  []    Swallowing concerns? [x]  []    PEG? []  []     Referrals Yes No Comments  Social Work? []  [x]    Dentistry? []  [x]    Swallowing therapy? []  [x]    Nutrition? []  [x]    Med/Onc? []  [x]     Safety Issues Yes No Comments  Prior radiation? []  [x]    Pacemaker/ICD? []  [x]    Possible current pregnancy? []  [x]    Is the patient on methotrexate? []  [x]     Tobacco/Marijuana/Snuff/ETOH use: never smoked, used smokeless tabacco (chew), drinks 6 glasses of wine per week  Past/Anticipated interventions by otolaryngology, if any:  05/23/14 - partial glossectomy, neck dissection, tracheostomy, submental flap and skin graft by Dr. Conley Canal.  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

## 2014-06-15 ENCOUNTER — Encounter: Payer: Self-pay | Admitting: *Deleted

## 2014-06-15 ENCOUNTER — Ambulatory Visit
Admission: RE | Admit: 2014-06-15 | Discharge: 2014-06-15 | Disposition: A | Discharge status: Home / Self Care | Payer: 59 | Source: Ambulatory Visit | Attending: Radiation Oncology | Admitting: Radiation Oncology

## 2014-06-15 ENCOUNTER — Telehealth: Payer: Self-pay | Admitting: *Deleted

## 2014-06-15 ENCOUNTER — Encounter: Payer: Self-pay | Admitting: Radiation Oncology

## 2014-06-15 DIAGNOSIS — C020 Malignant neoplasm of dorsal surface of tongue: Secondary | ICD-10-CM

## 2014-06-15 DIAGNOSIS — L599 Disorder of the skin and subcutaneous tissue related to radiation, unspecified: Secondary | ICD-10-CM | POA: Insufficient documentation

## 2014-06-15 DIAGNOSIS — Z87891 Personal history of nicotine dependence: Secondary | ICD-10-CM | POA: Insufficient documentation

## 2014-06-15 DIAGNOSIS — Z79891 Long term (current) use of opiate analgesic: Secondary | ICD-10-CM | POA: Diagnosis not present

## 2014-06-15 DIAGNOSIS — Z7952 Long term (current) use of systemic steroids: Secondary | ICD-10-CM | POA: Diagnosis not present

## 2014-06-15 DIAGNOSIS — K1233 Oral mucositis (ulcerative) due to radiation: Secondary | ICD-10-CM | POA: Diagnosis not present

## 2014-06-15 DIAGNOSIS — I951 Orthostatic hypotension: Secondary | ICD-10-CM | POA: Diagnosis not present

## 2014-06-15 DIAGNOSIS — Z940 Kidney transplant status: Secondary | ICD-10-CM | POA: Insufficient documentation

## 2014-06-15 DIAGNOSIS — Z79899 Other long term (current) drug therapy: Secondary | ICD-10-CM | POA: Insufficient documentation

## 2014-06-15 DIAGNOSIS — R634 Abnormal weight loss: Secondary | ICD-10-CM | POA: Diagnosis not present

## 2014-06-15 DIAGNOSIS — Z510 Encounter for antineoplastic radiation therapy: Secondary | ICD-10-CM | POA: Insufficient documentation

## 2014-06-15 HISTORY — DX: Disorder of kidney and ureter, unspecified: N28.9

## 2014-06-15 HISTORY — DX: Essential (primary) hypertension: I10

## 2014-06-15 HISTORY — DX: Idiopathic aseptic necrosis of unspecified femur: M87.059

## 2014-06-15 HISTORY — DX: Malignant neoplasm of tongue, unspecified: C02.9

## 2014-06-15 MED ORDER — BIAFINE EX EMUL
Freq: Two times a day (BID) | CUTANEOUS | Status: DC
  Administered 2014-06-15: 14:00:00 via TOPICAL

## 2014-06-15 NOTE — Progress Notes (Signed)
Met with patient and his wife during initial consult with Dr. Pablo Ledger. 1. Introduced myself as their Navigator, explained my role as a member of the Care Team, provided contact information, encouraged them to contact me with questions/concerns as treatments/procedures begin. 2. Provided New Patient Information packet:  Contact information for physician and navigator  Advance Directive information ([place] blue pamphlet).  They stated they have this documentation, will bring a copy for our records.   Fall Prevention Patient Safety Plan  [facility] campus map with highlight of [place] 3. Provided introductory explanation of radiation treatment including SIM planning and fitting of head mask, showed them example of closed and open mask.  Pt indicated he will be having the open mask.   4. Provided a tour of SIM and Tomo areas, explained treatment and arrival procedures.   They verbalized understanding of information provided.    Gayleen Orem, RN, BSN, [place] at [place] [phone]

## 2014-06-15 NOTE — Addendum Note (Signed)
Encounter addended by: Thea Silversmith, MD on: 06/15/2014 12:43 PM<BR>     Documentation filed: Dx Association, Orders

## 2014-06-15 NOTE — Addendum Note (Signed)
Encounter addended by: Arlyss Repress, RN on: 06/15/2014  4:31 PM<BR>     Documentation filed: Charges VN

## 2014-06-15 NOTE — Telephone Encounter (Signed)
CALLED PATIENT TO INFORM OF NUTRITION APPT. ON 06-23-14 @ 9 AM WITH BARBARA NEFF AND HIS CARL SCHINKE APPT. ON 06-26-14 - ARRIVAL TIME - 3 PM @ [facility], LVM FOR A RETURN CALL

## 2014-06-15 NOTE — Progress Notes (Signed)
Radiation Oncology         [phone]) [phone] ________________________________  Initial outpatient Consultation - Date: 06/15/2014   Name: [name] MRN: [id number]   DOB: 10/08/1960  REFERRING PHYSICIAN: Fredricka Bonine, *  DIAGNOSIS:    ICD-9-CM ICD-10-CM   1. T2N2 tongue cancer 141.1 C02.0     STAGE: T2N2 tongue cancer   Staging form: Lip and Oral Cavity, AJCC 7th Edition     Pathologic stage from 06/15/2014: Stage IVA (T2, N2, cM0) - Signed by Thea Silversmith, MD on 06/15/2014   HISTORY OF PRESENT ILLNESS::[name] is a 53 y.o. male  Who noticed a sore on his tongue in August of this year. He was ultimately seen by an oral surgeon for some cosmetic work and a biopsy was performed which showed squamous cell carcinoma.  He has a history of immunosuppression due to a kidney transplant many years ago. He was referred to Dr. Conley Canal who preformed a physical exam and staging studies. PE showed a 2 cm right mid to posterior oral tongue ulcerated lesion involving the lateral tongue and toward the floor of mouth. He had a clinically negative neck. PET CT showed uptake in the primary as well as a right Level 2 lymph node.  No evidence of metastatic disease was noted. CT of the neck showed the primary but no adenopathy.  He underwent a right hemiglossectomy and bilateral selective neck dissections (Level 1-3 on right and level 1 on left) with flap reconstruction on 11/17. This showed 2/25 lymph nodes positive with LVI and negative margins. No extracapsular extension was noted. He has recovered well from surgery. He is working on his speech. He lost about 15 pounds during the course of his hospitalization and is working on gaining that back. He met with speech pathology prior to discharge and passed his swallowing evaluation.  He is accompanied by his wife today. No history of radiation. He did use smokeless tobacco but has quit. He has seen the transplant team and they have stopped his Cell  Cept but continued his prednisone and cyclosporine. He was referred to me today by Dr. Conley Canal for my opinion regarding radiation in the management of his disease.   PREVIOUS RADIATION THERAPY: No  FAMILY HISTORY: No family history on file.  SOCIAL HISTORY:  History  Substance Use Topics  . Smoking status: Not on file  . Smokeless tobacco: Former Systems developer    Types: Chew  . Alcohol Use: 3.6 oz/week    6 Glasses of wine per week    REVIEW OF SYSTEMS:  A 15 point review of systems is documented in the electronic medical record. This was obtained by the nursing staff. However, I reviewed this with the patient to discuss relevant findings and make appropriate changes.  Pertinent positives are included in the chart.   PHYSICAL EXAM: Pleasant male in no distress. Healing submental incision with no signs of infection. Graft is healing well with good granulation tissue. No evidence of recurrence. No palpable lymph adenopathy bilaterally. [place] facial motion. Alert and oriented x 3.   IMPRESSION: T2N2 SCCA of the right tongue (negative margins, no ECE, +LVI)  PLAN: The patient is a good candidate for radiotherapy. The patient has been discussed in detail at [facility] tumor board and we will await their recommendations regarding chemotherapy (I would think no as he does not have risk factors. I will follow up with Dr. Conley Canal regarding this). Plan is as below:   1) Referral has been made to Dr.  Owlsley for dental evaluation/extractions in preparation for radiation in the vicinity of the mouth. Dr. Derenda Mis is familiar with the patient as he began this process and will hopefully not have to repeat much of the same work up.  I spoke with him today and he is going to try to see the patient early next week. I will send on the radiation treatment plan and will ask him to make scatter guards if possible for radiation treatment. I asked his wife to call us as soon as he knows when his extractions will be.    2) Today in multidisciplinary clinic the patient will see Polo Riley from social work for social support  3) He will see our dietician for nutrition support - anticipate PEG tube only if chemotherapy is pursued.   4) Will refer to swallowing therapy for evaluation and prophylactic treatment as needed for dysphagia, which can worsen during or after chemoradiotherapy.   5) Simulation once extractions have been performed and he has been released by Dr. Conley Canal.  He is scheduled to see Dr. Conley Canal on 12/21. I will plan on starting treatment the first week of January so will likely schedule simulation the week of 12/21 or 12/28.  He is going to visit his son in [place] the week of 12/21 so we will have to work around that a bit.   6) Gayleen Orem, RN, our Head and Neck Oncology Navigator  Was able to meet with the patient and his wife to provide an orientation to the clinic and will be his main point of contact for scheduling/appointments/etc.   I spent 60 minutes  face to face with the patient and more than 50% of that time was spent in counseling and/or coordination of care.   ------------------------------------------------  Thea Silversmith, MD

## 2014-06-15 NOTE — Addendum Note (Signed)
Encounter addended by: Andria Rhein, RN on: 06/15/2014  1:39 PM<BR>     Documentation filed: Medications, Inpatient MAR

## 2014-06-15 NOTE — Addendum Note (Signed)
Encounter addended by: Andria Rhein, RN on: 06/15/2014  1:38 PM<BR>     Documentation filed: Dx Association, Orders

## 2014-06-16 ENCOUNTER — Telehealth: Payer: Self-pay | Admitting: *Deleted

## 2014-06-16 NOTE — Telephone Encounter (Signed)
Returned patient wife call, confirmed she had received Biafine yesterday, provided clarification for upcoming Speech and Nutrition appts.  Gayleen Orem, RN, BSN, [place] at [place] [phone]

## 2014-06-20 ENCOUNTER — Telehealth: Payer: Self-pay | Admitting: *Deleted

## 2014-06-20 NOTE — Telephone Encounter (Signed)
Returned patient wife VM: 1. Answered questions re: RT, explained that information re: tmt plan has been faxed to Dr. Benson Norway for patient's appt this afternoon, 2. Discussed this Thursday morning's appt with Dr. Enrique Sack, his evaluation with recommendation for extractions.  They have been told by Dr. Benson Norway that 8 extractions are needed, they want second opinion before proceeding. 3. Need to reschedule 06/26/14 SLP appt b/c it conflicts with appt in [place].  I explained I would arrange.  Gayleen Orem, RN, BSN, [place] at [place] [phone]

## 2014-06-22 ENCOUNTER — Ambulatory Visit (HOSPITAL_COMMUNITY): Payer: Self-pay | Admitting: Dentistry

## 2014-06-22 ENCOUNTER — Encounter (HOSPITAL_COMMUNITY): Payer: Self-pay | Admitting: Dentistry

## 2014-06-22 VITALS — BP 100/60 | HR 67 | Temp 98.1°F

## 2014-06-22 DIAGNOSIS — IMO0002 Reserved for concepts with insufficient information to code with codable children: Secondary | ICD-10-CM

## 2014-06-22 DIAGNOSIS — K0530 Chronic periodontitis, unspecified: Secondary | ICD-10-CM

## 2014-06-22 DIAGNOSIS — C020 Malignant neoplasm of dorsal surface of tongue: Secondary | ICD-10-CM

## 2014-06-22 DIAGNOSIS — K030 Excessive attrition of teeth: Secondary | ICD-10-CM

## 2014-06-22 DIAGNOSIS — K011 Impacted teeth: Secondary | ICD-10-CM

## 2014-06-22 DIAGNOSIS — K08409 Partial loss of teeth, unspecified cause, unspecified class: Secondary | ICD-10-CM

## 2014-06-22 DIAGNOSIS — K036 Deposits [accretions] on teeth: Secondary | ICD-10-CM

## 2014-06-22 DIAGNOSIS — M2632 Excessive spacing of fully erupted teeth: Secondary | ICD-10-CM

## 2014-06-22 DIAGNOSIS — Z0189 Encounter for other specified special examinations: Secondary | ICD-10-CM

## 2014-06-22 DIAGNOSIS — M264 Malocclusion, unspecified: Secondary | ICD-10-CM

## 2014-06-22 MED ORDER — SODIUM FLUORIDE 1.1 % DT GEL: DENTAL | Status: DC

## 2014-06-22 NOTE — Progress Notes (Signed)
DENTAL CONSULTATION  Date of Consultation:  06/22/2014 Patient Name:   [name] Date of Birth:   1961/03/02 Medical Record Number: [id number]  VITALS: BP 100/60 mmHg  Pulse 67  Temp(Src) 98.1 F (36.7 C) (Oral)  CHIEF COMPLAINT: Patient is referred for a preradiation therapy dental protocol examination.  HPI: [name] is a 53 year old male referred by Dr. Frederik Schmidt and Dr. Thea Silversmith for a dental consultation. The patiwnt was recent diagnosed with right lateral tongue cancer. Patient is status post surgical resection, neck dissection, and flap reconstructive surgery with Dr. Conley Canal at [facility] on 05/23/2014. Patient with anticipated postoperative radiation therapy with Dr. Pablo Ledger. Patient is now seen as part of a preradiation therapy dental protocol examination.  The patient currently denies acute toothaches, swellings, or abscesses. Patient was seen for initial consultation on 06/20/2014 by oral surgeon, Dr. Frederik Schmidt, with plan for multiple extractions with alveoloplasty. Patient is now seen as part of a preradiation therapy dental protocol to discuss potential dental complications of radiation therapy and to further evaluate patient for the anticipated plan of care. Patient was last seen by his primary dentist, Dr. Arman Bogus for an exam and cleaning in October 2015. Patient is seen on every 4-6 month basis for periodontal care. Patient currently has no unmet dental needs by patient report.  PROBLEM LIST: Patient Active Problem List   Diagnosis Date Noted  . T2N1 tongue cancer 06/15/2014    Priority: High    PMH: Past Medical History  Diagnosis Date  . HTN (hypertension)   . Renal disease   . Avascular necrosis of hip     bialateral hips  . Tongue cancer     right    PSH: Past Surgical History  Procedure Laterality Date  . 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 Total hip arthroplasty Bilateral   . Hip surgery      bone transplant per pt to both hips   . Perm cath placed and removed    . Partial glossectomy Bilateral 05/23/14    Dr. Conley Canal [facility]  . Neck dissection Bilateral 05/23/14  . Tracheostomy  05/23/14  . Submental flap  05/23/14  . Skin graft  05/23/14    split thickness skin graft neck poss    ALLERGIES: Allergies  Allergen Reactions  . Vancomycin Rash    MEDICATIONS: Current Outpatient Prescriptions  Medication Sig Dispense Refill  . cycloSPORINE (SANDIMMUNE) 100 MG capsule Take 100 mg by mouth daily.    . cycloSPORINE (SANDIMMUNE) 100 MG capsule Take 100 mg by mouth 2 (two) times daily. 125 mg in morning and  100 mg at night    . emollient (BIAFINE) cream Apply topically 2 (two) times daily.    [place] Kitchen losartan (COZAAR) 50 MG tablet Take 50 mg by mouth daily.    . prednisoLONE 5 MG TABS tablet Take 5 mg by mouth daily.     No current facility-administered medications for this visit.    LABS: Lab Results  Component Value Date   HGB 12.9* 01/07/2011   HCT 38.0* 01/07/2011      Component Value Date/Time   NA 137 01/07/2011 1558   K 4.8 01/07/2011 1558   CL 104 01/07/2011 1558   GLUCOSE 97 01/07/2011 1558   BUN 26* 01/07/2011 1558   CREATININE 1.30 01/07/2011 1558   No results found for: INR, PROTIME No results found for: PTT  SOCIAL HISTORY: History   Social History  . Marital Status: Married    Spouse Name:  N/A    Number of Children: 1  . Years of Education: N/A   Occupational History  . Not on file.   Social History Main Topics  . Smoking status: Never Smoker   . Smokeless tobacco: Former Systems developer    Types: [place] date: 04/06/2014  . Alcohol Use: 3.6 oz/week    6 Glasses of wine per week  . Drug Use: No  . Sexual Activity: Not on file   Other Topics Concern  . Not on file   Social History Narrative    FAMILY HISTORY: Family History  Problem Relation Age of Onset  . Thyroid disease Mother     REVIEW OF SYSTEMS: Reviewed with the patient and is included in dental  record.  DENTAL HISTORY: CHIEF COMPLAINT: Patient is referred for a preradiation therapy dental protocol examination.  HPI: [name] is a 53 year old male referred by Dr. Frederik Schmidt and Dr. Thea Silversmith for a dental consultation. The patiwnt was recent diagnosed with right lateral tongue cancer. Patient is status post surgical resection, neck dissection, and flap reconstructive surgery with Dr. Conley Canal at [facility] on 05/23/2014. Patient with anticipated postoperative radiation therapy with Dr. Pablo Ledger. Patient is now seen as part of a preradiation therapy dental protocol examination.  The patient currently denies acute toothaches, swellings, or abscesses. Patient was seen for initial consultation on 06/20/2014 by oral surgeon, Dr. Frederik Schmidt, with plan for multiple extractions with alveoloplasty. Patient is now seen as part of a preradiation therapy dental protocol to discuss potential dental complications of radiation therapy and to further evaluate patient for the anticipated plan of care. Patient was last seen by his primary dentist, Dr. Arman Bogus for an exam and cleaning in October 2015. Patient is seen on every 4-6 month basis for periodontal care. Patient currently has no unmet dental needs by patient report.  DENTAL EXAMINATION: GENERAL: The patient is a well-developed, well-nourished male in no acute distress. HEAD AND NECK: The left and right neck are consistent with previous surgery with Dr. Conley Canal. The patient denies acute TMJ symptoms. The maximum interincisal opening is measured at 40 mm. INTRAORAL EXAM: The patient has normal saliva. The right tongue and floor of mouth is consistent with previous right partial glossectomy, and flap and reconstructive surgery. DENTITION: The patient is missing tooth numbers 3, 17, and 32. Tooth #16 is impacted. PERIODONTAL: Patient has chronic periodontitis with plaque and calculus accumulations, selective areas of gingival  recession, and no significant tooth mobility. DENTAL CARIES/SUBOPTIMAL RESTORATIONS: There are no obvious dental caries noted. Multiple areas of incisal attrition are noted. ENDODONTIC: The patient currently denies acute pulpitis symptoms. Tooth #14 has had a previous root canal therapy with no obvious persistent periapical radiolucency or symptoms. CROWN AND BRIDGE: The patient has crowns on tooth #14 and 20 that appear to be acceptable. PROSTHODONTIC: The patient has no partial dentures. OCCLUSION: The patient has a poor occlusal scheme secondary to missing teeth, supra-eruption and drifting of the unopposed teeth into the edentulous areas, multiple malpositioned teeth, multiple diastemas, and lack of replacement of all missing teeth with dental prostheses.  RADIOGRAPHIC INTERPRETATION: An orthopantogram was taken on 06/22/2014 and this was supplemented with 12 periapical radiographs and 2 bitewings. There are missing tooth numbers 3, 17, and 32. Tooth #16 is impacted. Multiple diastemas are noted. There is supra-eruption and drifting of the unopposed teeth into the edentulous areas. Multiple teeth with incisal attrition are noted. Tooth #14 has a previous root canal therapy with no  obvious persistent periapical radiolucency.   ASSESSMENTS: 1. Right lateral tongue cancer- status post right partial glossectomy, neck dissection, and flap reconstructive surgery with Dr. Conley Canal. 2. Preradiation therapy dental protocol examination 3. Chronic periodontitis with bone loss 4. Accretions-minimal 5. Multiple missing teeth 6. Impacted tooth number 16 7. Supra-eruption and drifting of the unopposed teeth into the edentulous areas 8. Poor occlusal scheme but stable occlusion 9. Multiple teeth with incisal attrition 10. Status post kidney transplant surgery with need for antibiotic premedication prior to invasive dental procedures. 11. Status post bilateral hip replacement surgeries with Dr. Alvan Dame with  possible need for an about a premedication prior to invasive dental procedures.  PLAN/RECOMMENDATIONS: 1. I discussed the risks, benefits, and complications of various treatment options with the patient in relationship to his medical and dental conditions, anticipated radiation therapy, and radiation therapy side effects to include xerostomia, radiation caries, trismus, mucositis, taste changes, gum and jawbone changes, and risk for infection, bleeding, and osteoradionecrosis. We discussed various treatment options to include no treatment, multiple extractions with alveoloplasty, pre-prosthetic surgery as indicated, periodontal therapy, dental restorations, root canal therapy, crown and bridge therapy, implant therapy, and replacement of missing teeth as indicated. We also discussed fabrication of fluoride trays, scatter guards, and use of a mouth prop during radiation therapy to assist in separation of the upper teeth from the lower teeth during radiation therapy. The patient currently wishes to proceed with treatment with Dr. Benson Norway for the extraction of tooth numbers 1, 15, 16, 18, 19, 20, 29, 30, and 31 with alveoloplasty as needed. This surgery scheduled for 06/27/2014 at 12 noon. Patient also agrees to impressions today for the fabrication of fluoride trays.  A prescription for FluoriSHIELD was sent to [place] [facility] with refills for one year. Patient will proceed with simulation procedure with Dr. Pablo Ledger after the Christmas holiday with start of radiation therapy approximately 2 weeks after the teeth were then extracted (07/12/2014). Dr. Benson Norway and Dr. Pablo Ledger are to determine when the radiation therapy is to start based on healing. Patient will then follow the dental medicine for insertion of fluoride trays and for a oral examination during radiation therapy.  2. Discussion of findings with medical team and coordination of future medical and dental care as needed.  I spent in  excess of  120 minutes during the conduct of this consultation and >50% of this time involved direct face-to-face encounter for counseling and/or coordination of the patient's care.    Lenn Cal, DDS

## 2014-06-22 NOTE — Patient Instructions (Signed)

## 2014-06-23 ENCOUNTER — Ambulatory Visit: Payer: 59 | Admitting: Nutrition

## 2014-06-23 ENCOUNTER — Ambulatory Visit: Payer: 59

## 2014-06-23 NOTE — Progress Notes (Signed)
This patient is a 53 year old male diagnosed with tongue cancer, status post partial glossectomy, trach, flap and skin graft.  He is a patient of Dr. Pablo Ledger.  Past medical history includes hypertension, renal disease, avascular necrosis of his hip and smokeless tobacco.  Medications include prednisone.  Labs were reviewed.  Height: 6 feet 0 inches. Weight: 159.8 pounds. Usual body weight: 170 pounds. BMI: 21.67.  Patient reports he follows a vegetarian diet.  He has been drinking 2 boost plus daily along with one Premier protein shake a day.  He would like to regain the weight lost at the time of surgery.  Currently patient is 10 pounds below usual body weight.  Patient's wife is extremely supportive and focuses on providing patient with high protein snacks and meals.  Nutrition diagnosis: Food and nutrition related knowledge deficit related to diagnosis of tongue cancer and associated treatments as evidenced by no prior need for nutrition related information.  Intervention: Patient educated to consume small frequent meals with high-calorie, high-protein foods. Provided samples of oral nutrition supplements for patient to try. Recommended patient continue a total of 3 oral nutrition supplements daily as tolerated. Educated patient on strategies for easier chewing and swallowing as treatment progresses. Provided fact sheets and contact information. Questions were answered.  Teach back method was used.  Monitoring, evaluation, goals: Patient will tolerate adequate calories and protein to minimize weight loss throughout treatment.  Next visit: To be scheduled with radiation therapy.  **Disclaimer: This note was dictated with voice recognition software. Similar sounding words can inadvertently be transcribed and this note may contain transcription errors which may not have been corrected upon publication of note.**

## 2014-06-26 ENCOUNTER — Ambulatory Visit: Payer: 59

## 2014-06-29 ENCOUNTER — Telehealth: Payer: Self-pay | Admitting: *Deleted

## 2014-06-29 NOTE — Telephone Encounter (Signed)
Spoke with patient wife: 1. She reported he is "doing well" after tooth extractions. 2. We discussed his attendance at the 07/12/14 [facility] for PT Lymphedema evaluation.  I explained the purpose of the PT evaluation, she verbalized understanding.  I provided a 12:30 arrival time.   3. She confirmed understanding of 07/04/14 1:00 pm CT SIM appt.   4. She provided information on swallowing therapy guidelines provided by Dr. Jenne Campus therapist, indicated patient will be seen by Dr. Conley Canal on regular basis during his RT. She understands she can contact me with questions/concerns.  Gayleen Orem, RN, BSN, [place] at [place] [phone]

## 2014-07-04 ENCOUNTER — Ambulatory Visit
Admission: RE | Admit: 2014-07-04 | Discharge: 2014-07-04 | Disposition: A | Discharge status: Home / Self Care | Payer: 59 | Source: Ambulatory Visit | Attending: Radiation Oncology | Admitting: Radiation Oncology

## 2014-07-04 ENCOUNTER — Encounter: Payer: Self-pay | Admitting: *Deleted

## 2014-07-04 DIAGNOSIS — Z510 Encounter for antineoplastic radiation therapy: Secondary | ICD-10-CM | POA: Diagnosis not present

## 2014-07-04 DIAGNOSIS — C020 Malignant neoplasm of dorsal surface of tongue: Secondary | ICD-10-CM

## 2014-07-04 NOTE — Progress Notes (Signed)
To provide support and encouragement, care continuity and to assess for needs, met with patient and his wife during CT SIM.  He denied needs at this time, understands he can contact me.  Gayleen Orem, RN, BSN, [place] at [place] [phone]

## 2014-07-04 NOTE — Progress Notes (Addendum)
[facility] Simulation/Treatment Planning/IMRT note   [name]  [id number] 07/04/2014   Dx: T2N1 tongue cancer   Staging form: Lip and Oral Cavity, AJCC 7th Edition     Pathologic stage from 06/15/2014: Stage IVA (T2, N2, cM0) - Signed by Thea Silversmith, MD on 06/15/2014  CONSENT VERIFIED:yes  SET UP: Patient is set-up supine   IMMOBILIZATION: The following immobilization is used:Aquaplast Mask   NARRATIVE:The patient was brought to the [place].  Identity was confirmed.  All relevant records and images related to the planned course of therapy were reviewed.  Then, the patient was positioned in a stable reproducible clinical set-up for radiation therapy using an aquaplast mask. The positioning device was placed in the right jaw to separate the palate and the tongue.  IV contrast was not administered due to his renal function and CT images were obtained.  Skin markings were placed.  The CT images were loaded into the planning software where the target and avoidance structures were contoured.  The patient's previous PET scan was fused with the planning CT to aid in target delineation. The radiation prescription was entered and confirmed.   TREATMENT PLANNING NOTE:  Treatment planning then occurred. I have requested : Intensity Modulated Radiotherapy (IMRT) is medically necessary for this case for parotid sparing which results in a gain of quality of life by preservation of salivary gland function. In addition the tumor volume is in close proximity to the spinal cord.   I personally supervised and approved the construction of a unique IMRT devices

## 2014-07-10 ENCOUNTER — Encounter (HOSPITAL_COMMUNITY): Payer: Self-pay | Admitting: Dentistry

## 2014-07-10 ENCOUNTER — Ambulatory Visit (HOSPITAL_COMMUNITY): Payer: Medicaid - Dental | Admitting: Dentistry

## 2014-07-10 VITALS — BP 136/67 | HR 70 | Temp 98.0°F

## 2014-07-10 DIAGNOSIS — C020 Malignant neoplasm of dorsal surface of tongue: Secondary | ICD-10-CM

## 2014-07-10 DIAGNOSIS — Z463 Encounter for fitting and adjustment of dental prosthetic device: Secondary | ICD-10-CM

## 2014-07-10 DIAGNOSIS — K08409 Partial loss of teeth, unspecified cause, unspecified class: Secondary | ICD-10-CM

## 2014-07-10 DIAGNOSIS — Z0189 Encounter for other specified special examinations: Secondary | ICD-10-CM

## 2014-07-10 DIAGNOSIS — Z01818 Encounter for other preprocedural examination: Secondary | ICD-10-CM

## 2014-07-10 NOTE — Progress Notes (Signed)
07/10/2014  Patient Name:   [name] Date of Birth:   06-19-1961 Medical Record Number: [id number]  BP 136/67 mmHg  Pulse 70  Temp(Src) 98 F (36.7 C) (Oral)  [name] now presents for insertion of upper and lower fluoride trays. The patient had tooth numbers 1, 15, 16, 18, 19, 20, 29, 30, and 31 extracted by Dr. Benson Norway on 06/27/2014. Patient was seen for postop evaluation by Dr. Benson Norway on 07/05/2014. Patient is anticipated to start radiation therapy on 07/17/2014.  PROCEDURE: Extraction sites appear to be healing in acceptably. Appliances were tried in and adjusted as needed. [place] Trismus device was previously fabricated 40 mm using 23 sticks. Postop instructions were provided and a written and verbal format concerning the use and care of appliances. All questions were answered. Patient to return to clinic for periodic oral examination in approximately 2-3 weeks during radiation therapy. Patient to call if questions or problems arise before then.  Lenn Cal, DDS

## 2014-07-10 NOTE — Patient Instructions (Signed)

## 2014-07-11 ENCOUNTER — Telehealth: Payer: Self-pay | Admitting: *Deleted

## 2014-07-11 NOTE — Telephone Encounter (Signed)
Spoke with patient wife, confirmed a 12:15 arrival for tomorrow's 12:30 [place] for Lymphedema PT eval.  She verbalized understanding.  Gayleen Orem, RN, BSN, [place] at [place] [phone]

## 2014-07-12 ENCOUNTER — Ambulatory Visit: Payer: 59 | Attending: Radiation Oncology | Admitting: Physical Therapy

## 2014-07-12 ENCOUNTER — Encounter: Payer: Self-pay | Admitting: *Deleted

## 2014-07-12 DIAGNOSIS — M436 Torticollis: Secondary | ICD-10-CM | POA: Diagnosis not present

## 2014-07-12 DIAGNOSIS — Z510 Encounter for antineoplastic radiation therapy: Secondary | ICD-10-CM | POA: Diagnosis not present

## 2014-07-12 NOTE — Therapy (Signed)
[place], [place], [address] Phone: [phone]   Fax:  [phone]  Physical Therapy Evaluation  Patient Details  Name: [name] MRN: [id number] Date of Birth: 26-Aug-1960  Encounter Date: 07/12/2014      PT End of Session - 07/12/14 1340    Visit Number 1   Number of Visits 1   Date for PT Re-Evaluation 09/09/14   PT Start Time 9038   PT Stop Time 1315   PT Time Calculation (min) 30 min      Past Medical History  Diagnosis Date  . HTN (hypertension)   . Renal disease   . Avascular necrosis of hip     bialateral hips  . Tongue cancer     right    Past Surgical History  Procedure Laterality Date  . 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 Total hip arthroplasty Bilateral   . Hip surgery      bone transplant per pt to both hips  . Perm cath placed and removed    . Partial glossectomy Bilateral 05/23/14    Dr. Conley Canal [facility]  . Neck dissection Bilateral 05/23/14  . Tracheostomy  05/23/14  . Submental flap  05/23/14  . Skin graft  05/23/14    split thickness skin graft neck poss    There were no vitals taken for this visit.  Visit Diagnosis:  Stiffness of neck - Plan: PT plan of care cert/re-cert      Subjective Assessment - 07/12/14 1335    Pertinent History h/o immunosuppression for kidney transplant          [facility] PT Assessment - 07/12/14 0001    Assessment   Medical Diagnosis s/p hemiglossectomy with flap reconstruction for tongue cancer 05/23/14   Precautions   Precautions Other (comment)   Precaution Comments cancer precautions   Restrictions   Weight Bearing Restrictions No   Balance Screen   Has the patient fallen in the past 6 months No   Has the patient had a decrease in activity level because of a fear of falling?  No   Is the patient reluctant to leave their home because of a fear of falling?  No   Home Environment   Living Enviornment Private residence   Living Arrangements Spouse/significant other   Type of [place] Two level   Prior Function   Level of Independence Independent with basic ADLs;Independent with gait   Leisure likes to play tennnis 2-3x/week   Observation/Other Assessments   Observations pocket of swelling visible at right submandible   Skin Integrity incision across full width of anterior neck healing well; trach incision also healing   Functional Tests   Functional tests Sit to Stand   Sit to Stand   Comments 7 times in 30 sectonds   Posture/Postural Control   Posture/Postural Control No significant limitations   Posture Comments just slight forward head   AROM   Overall AROM  Deficits   Overall AROM Comments shoulders WNL bilat   Cervical Flexion WFL   Cervical Extension 25% loss   Cervical - Right Side Bend WFL   Cervical - Left Side Bend 25% loss   Cervical - Right Rotation WFL   Cervical - Left Rotation WFL   Strength   Overall Strength Other (comment)   Overall Strength Comments Not formally tested, but functional   Ambulation/Gait   Ambulation/Gait Yes   Ambulation/Gait Assistance 7: Independent  LYMPHEDEMA/ONCOLOGY QUESTIONNAIRE - 07/12/14 1335    Type   Cancer Type T2N2 tongue cancer (squamous cell)   Surgeries   Other Surgery Date 05/24/15  right hemiglossectomy with flap reconstruction, lymph node   Lymphedema Assessments   Lymphedema Assessments Head and Neck   Head and Neck   4 cm superior to sternal notch around neck 35.5 cm   6 cm superior to sternal notch around neck 35.8 cm   8 cm superior to sternal notch around neck 36.7 cm   Other At 10 cm. proximal to sternal notch, 39.5 cm.                       PT Education - 07/12/14 1339    Education provided Yes   Education Details posture, neck ROM, walking program, lymphedema information   Person(s) Educated Patient;Spouse   Methods Explanation;Handout   Comprehension Verbalized understanding       Patient and his wife also asked about his doing some strengthening exercise at home.  We discussed this briefly, with general guidelines given to start at low weight and low reps, increasing reps from 10 to 2 sets of 10 and using weights that provided some challenge but not strain.  Pt. verbalized understanding.           Head and Neck Clinic Goals - 07/12/14 1342    Patient will be able to verbalize understanding of a home exercise program for cervical range of motion, posture, and walking.    Status Achieved   Patient will be able to verbalize understanding of proper sitting and standing posture.    Status Achieved   Patient will be able to verbalize understanding of lymphedema risk and availability of treatment for this condition.    Status Achieved           Plan - 07/12/14 1340    Clinical Impression Statement Pt. s/p hemglossectomy with flap reconstruction, healing well at neck with mild swelling at right neck, may benefit from therapy going forward if lymphedema or other limitations develop with radiation tratment.   Pt will benefit from skilled therapeutic intervention in order to improve on the following deficits Decreased range of motion;Increased edema;Decreased endurance   Rehab Potential Good   PT Frequency One time visit   PT Treatment/Interventions Patient/family education;Therapeutic exercise   PT Next Visit Plan If needed, reassess for neck swelling and ROM.   PT Home Exercise Plan see education section   Consulted and Agree with Plan of Care Patient         Problem List Patient Active Problem List   Diagnosis Date Noted  . T2N1 tongue cancer 06/15/2014    Fernando Torry 07/12/2014, 1:47 PM  [place], [place], [address] Phone: [phone]   Fax:  [phone]  Serafina Royals, [place]

## 2014-07-13 DIAGNOSIS — Z510 Encounter for antineoplastic radiation therapy: Secondary | ICD-10-CM | POA: Diagnosis not present

## 2014-07-13 NOTE — Progress Notes (Signed)
To provide support and encouragement, care continuity and to assess for needs, met with patient and his wife prior to his appt with PT.  He did not express any needs or concerns at this time, I encouraged him to contact me if that changes, he verbalized understanding.  Gayleen Orem, RN, BSN, [place] at [place] [phone]

## 2014-07-17 ENCOUNTER — Ambulatory Visit
Admission: RE | Admit: 2014-07-17 | Discharge: 2014-07-17 | Disposition: A | Discharge status: Home / Self Care | Payer: 59 | Source: Ambulatory Visit | Attending: Radiation Oncology | Admitting: Radiation Oncology

## 2014-07-17 ENCOUNTER — Encounter: Payer: Self-pay | Admitting: *Deleted

## 2014-07-17 DIAGNOSIS — Z510 Encounter for antineoplastic radiation therapy: Secondary | ICD-10-CM | POA: Diagnosis not present

## 2014-07-17 DIAGNOSIS — C020 Malignant neoplasm of dorsal surface of tongue: Secondary | ICD-10-CM

## 2014-07-17 MED ORDER — BIAFINE EX EMUL
CUTANEOUS | Status: DC | PRN
  Administered 2014-07-17: 14:00:00 via TOPICAL

## 2014-07-18 ENCOUNTER — Ambulatory Visit
Admission: RE | Admit: 2014-07-18 | Discharge: 2014-07-18 | Disposition: A | Discharge status: Home / Self Care | Payer: 59 | Source: Ambulatory Visit | Attending: Radiation Oncology | Admitting: Radiation Oncology

## 2014-07-18 VITALS — BP 136/80 | HR 68 | Temp 97.5°F | Wt 162.9 lb

## 2014-07-18 DIAGNOSIS — C020 Malignant neoplasm of dorsal surface of tongue: Secondary | ICD-10-CM

## 2014-07-18 DIAGNOSIS — Z510 Encounter for antineoplastic radiation therapy: Secondary | ICD-10-CM | POA: Diagnosis not present

## 2014-07-18 NOTE — Progress Notes (Signed)
Weekly assessment of radiation to head and neck.completed 28 of 30 fractions.Routine of clinic reviewed.No chemotherapy.Given Radiation Therapy and You Booklet, skin care sheet and biafine.Reviewed side effects of treatment with patient and wife to include fatigue, sore throat, weight loss, oral changes (mucositis and thick stringy saliva) and skin changes.Knows doctor day every Tuesday after treatment.Skin care to include mild sensitive skin soap, no shaving with razor and application of biafine twice daily once skin changes are evident but not 3 to 4 hours prior to radiation treatment.Both patient and wife able to verbalize back.

## 2014-07-18 NOTE — Progress Notes (Signed)
  Radiation Oncology         [phone] ________________________________  Name: [name] MRN: [id number]  Date: 07/18/2014  DOB: [birth year]  Weekly Radiation Therapy Management  T2N1 tongue cancer   Staging form: Lip and Oral Cavity, AJCC 7th Edition     Pathologic stage from 06/15/2014: Stage IVA (T2, N2, cM0) - Signed by Thea Silversmith, MD on 06/15/2014   Current Dose: 4 Gy     Planned Dose:  60 Gy  Narrative . . . . . . . . The patient presents for routine under treatment assessment.                                   The patient is without complaint.                                 Set-up films were reviewed.                                 The chart was checked. Physical Findings. . .  weight is 162 lb 14.4 oz (73.891 kg). His temperature is 97.5 F (36.4 C). His blood pressure is 136/80 and his pulse is 68. . The lungs are clear. The heart has a regular rhythm and rate. Examination of the oral cavity reveals surgical defect along the right oral tongue, granulation tissue noted. Impression . . . . . . . The patient is tolerating radiation. Plan . . . . . . . . . . . . Continue treatment as planned.  ________________________________   Blair Promise, PhD, MD

## 2014-07-18 NOTE — Progress Notes (Signed)
To provide support and encouragement, care continuity and to assess for needs, met with patient and his wife during [employer].  He tolerated tmt without difficulty.  We discussed registration/arrival/preparation procedures for future tmts.  He verbalized understanding, understands he can contact me with questions/concerns.  Gayleen Orem, RN, BSN, [place] at [place] [phone]

## 2014-07-19 ENCOUNTER — Ambulatory Visit
Admission: RE | Admit: 2014-07-19 | Discharge: 2014-07-19 | Disposition: A | Discharge status: Home / Self Care | Payer: 59 | Source: Ambulatory Visit | Attending: Radiation Oncology | Admitting: Radiation Oncology

## 2014-07-19 DIAGNOSIS — Z510 Encounter for antineoplastic radiation therapy: Secondary | ICD-10-CM | POA: Diagnosis not present

## 2014-07-20 ENCOUNTER — Ambulatory Visit
Admission: RE | Admit: 2014-07-20 | Discharge: 2014-07-20 | Disposition: A | Discharge status: Home / Self Care | Payer: 59 | Source: Ambulatory Visit | Attending: Radiation Oncology | Admitting: Radiation Oncology

## 2014-07-20 DIAGNOSIS — Z510 Encounter for antineoplastic radiation therapy: Secondary | ICD-10-CM | POA: Diagnosis not present

## 2014-07-21 ENCOUNTER — Ambulatory Visit
Admission: RE | Admit: 2014-07-21 | Discharge: 2014-07-21 | Disposition: A | Discharge status: Home / Self Care | Payer: 59 | Source: Ambulatory Visit | Attending: Radiation Oncology | Admitting: Radiation Oncology

## 2014-07-21 ENCOUNTER — Encounter: Payer: Self-pay | Admitting: *Deleted

## 2014-07-21 DIAGNOSIS — Z510 Encounter for antineoplastic radiation therapy: Secondary | ICD-10-CM | POA: Diagnosis not present

## 2014-07-23 NOTE — Progress Notes (Signed)
To provide support and encouragement, care continuity and to assess for needs, met with patient prior to Tomo tmt. He reported:  Tmts are going well.  Appreciation for therapist and navigator support. We discussed potential SEs as tmts progress.   I reminded him that he will see Dr. Pablo Ledger every Tuesday during tmts for evaluation. He verbalized understanding of information provided. He understands he can contact me with needs/concerns.  Gayleen Orem, RN, BSN, [place] at [place] [phone]

## 2014-07-24 ENCOUNTER — Ambulatory Visit
Admission: RE | Admit: 2014-07-24 | Discharge: 2014-07-24 | Disposition: A | Discharge status: Home / Self Care | Payer: 59 | Source: Ambulatory Visit | Attending: Radiation Oncology | Admitting: Radiation Oncology

## 2014-07-24 DIAGNOSIS — Z510 Encounter for antineoplastic radiation therapy: Secondary | ICD-10-CM | POA: Diagnosis not present

## 2014-07-25 ENCOUNTER — Ambulatory Visit
Admission: RE | Admit: 2014-07-25 | Discharge: 2014-07-25 | Disposition: A | Discharge status: Home / Self Care | Payer: 59 | Source: Ambulatory Visit | Attending: Radiation Oncology | Admitting: Radiation Oncology

## 2014-07-25 VITALS — BP 129/86 | HR 61 | Temp 98.2°F

## 2014-07-25 DIAGNOSIS — Z510 Encounter for antineoplastic radiation therapy: Secondary | ICD-10-CM | POA: Diagnosis not present

## 2014-07-25 DIAGNOSIS — C020 Malignant neoplasm of dorsal surface of tongue: Secondary | ICD-10-CM

## 2014-07-25 NOTE — Progress Notes (Signed)
Weekly Management Note Current Dose:14 Gy  Projected Dose:60 Gy   Narrative:  The patient presents for routine under treatment assessment.  CBCT/MVCT images/Port film x-rays were reviewed.  The chart was checked. Doing well. Noted a small red area on the top of his tongue at suture line. No complaints of dysphagia. Tired. Working full time. Using biafene on skin.   Physical Findings:  Flap appears healthy. Gums are well healed. 2 mm area of red fleshy skin at the anterior-lateral aspect of tongue at suture line. Appears to be healthy tissue Does not appear to be recurrent disease. No skin changes.   Vitals: There were no vitals filed for this visit. Weight:  Wt Readings from Last 3 Encounters:  07/18/14 162 lb 14.4 oz (73.891 kg)  06/23/14 159 lb 12.8 oz (72.485 kg)   Lab Results  Component Value Date   HGB 12.9* 01/07/2011   HCT 38.0* 01/07/2011   Lab Results  Component Value Date   CREATININE 1.30 01/07/2011   BUN 26* 01/07/2011   NA 137 01/07/2011   K 4.8 01/07/2011   CL 104 01/07/2011     Impression:  The patient is tolerating radiation.  Plan:  Continue treatment as planned. Monitor this area on tongue. Pt sees Dr. Conley Canal Monday.

## 2014-07-26 ENCOUNTER — Ambulatory Visit
Admission: RE | Admit: 2014-07-26 | Discharge: 2014-07-26 | Disposition: A | Discharge status: Home / Self Care | Payer: 59 | Source: Ambulatory Visit | Attending: Radiation Oncology | Admitting: Radiation Oncology

## 2014-07-26 DIAGNOSIS — Z510 Encounter for antineoplastic radiation therapy: Secondary | ICD-10-CM | POA: Diagnosis not present

## 2014-07-26 NOTE — Addendum Note (Signed)
Encounter addended by: Arlyss Repress, RN on: 07/26/2014  8:16 AM<BR>     Documentation filed: Vitals Section

## 2014-07-27 ENCOUNTER — Encounter: Payer: Self-pay | Admitting: *Deleted

## 2014-07-27 ENCOUNTER — Ambulatory Visit
Admission: RE | Admit: 2014-07-27 | Discharge: 2014-07-27 | Disposition: A | Discharge status: Home / Self Care | Payer: 59 | Source: Ambulatory Visit | Attending: Radiation Oncology | Admitting: Radiation Oncology

## 2014-07-27 DIAGNOSIS — Z510 Encounter for antineoplastic radiation therapy: Secondary | ICD-10-CM | POA: Diagnosis not present

## 2014-07-27 NOTE — Progress Notes (Signed)
Received call from patient.  He stated he developed notable cold symptoms yesterday afternoon, with slight fever in the HS but normal temp in the am.  He wanted to know if OK to take Robitussin which he has taken historically for colds.  Wanted to know also if Dr. Pablo Ledger wd write Rx for Z-Pack which he has historically taken.  I relayed questions to Dr. Pablo Ledger. Saw patient before he had Tomo tmt.  I relayed Dr. Unknown Jim response:  OK to take Robitussin, she does not recommend Z-Pack.  Suggested he check with Dr. Conley Canal re: transient low-grade fever. He indicated he has an appt with Dr. Conley Canal on Monday, will ask him then.  Stated also if fever reoccurs before then, he will contact his office. Patient asked if OK to rinse mouth with hydrogen peroxide to help with sore on tongue.  He has used in past.  I replied OK but nothing stronger than 50:50 mix.  Gayleen Orem, RN, BSN, [place] at [place] [phone]

## 2014-07-28 ENCOUNTER — Ambulatory Visit: Payer: 59

## 2014-07-31 ENCOUNTER — Telehealth: Payer: Self-pay | Admitting: *Deleted

## 2014-07-31 ENCOUNTER — Telehealth: Payer: Self-pay | Admitting: Nutrition

## 2014-07-31 ENCOUNTER — Ambulatory Visit
Admission: RE | Admit: 2014-07-31 | Discharge: 2014-07-31 | Disposition: A | Discharge status: Home / Self Care | Payer: 59 | Source: Ambulatory Visit | Attending: Radiation Oncology | Admitting: Radiation Oncology

## 2014-07-31 DIAGNOSIS — Z510 Encounter for antineoplastic radiation therapy: Secondary | ICD-10-CM | POA: Diagnosis not present

## 2014-07-31 NOTE — Telephone Encounter (Signed)
Contacted patient by phone at navigator request. Patient was unavailable.  Left message to call me back when it is convenient.

## 2014-07-31 NOTE — Telephone Encounter (Signed)
Received call from patient's wife.  She reported: Patient developed cold with low-grade fever over the weekend which continues to persist.  MD on call Rxed Augmentin.  Dr. Conley Canal aware, directed him to take hydrocodone/APAP liquid at night for pain sleep assist.  He is using salt/baking soda rinses to help with tongue sores; hydrogen peroxide:water rinse caused irritation.  He continues to use Mucinex for cold relief. Asked if nutritionist Dory Peru could consult with him when he comes in for tmt today or by phone otherwise.  I indicated I would inform her.  Gayleen Orem, RN, BSN, [place] at [place] [phone]

## 2014-08-01 ENCOUNTER — Other Ambulatory Visit: Payer: Self-pay | Admitting: Radiation Oncology

## 2014-08-01 ENCOUNTER — Ambulatory Visit (HOSPITAL_COMMUNITY): Payer: Self-pay | Admitting: Dentistry

## 2014-08-01 ENCOUNTER — Ambulatory Visit
Admission: RE | Admit: 2014-08-01 | Discharge: 2014-08-01 | Disposition: A | Discharge status: Home / Self Care | Payer: 59 | Source: Ambulatory Visit | Attending: Radiation Oncology | Admitting: Radiation Oncology

## 2014-08-01 DIAGNOSIS — Z510 Encounter for antineoplastic radiation therapy: Secondary | ICD-10-CM | POA: Diagnosis not present

## 2014-08-01 DIAGNOSIS — C020 Malignant neoplasm of dorsal surface of tongue: Secondary | ICD-10-CM

## 2014-08-01 MED ORDER — FLUCONAZOLE 40 MG/ML PO SUSR: ORAL | Status: DC

## 2014-08-01 MED ORDER — LIDOCAINE 5 % EX OINT: 1.0000 "application " | TOPICAL_OINTMENT | Freq: Three times a day (TID) | CUTANEOUS | Status: DC | PRN

## 2014-08-01 MED ORDER — NYSTATIN 100000 UNIT/ML MT SUSP: 5.0000 mL | Freq: Four times a day (QID) | OROMUCOSAL | Status: DC

## 2014-08-01 NOTE — Progress Notes (Signed)
Weekly Management Note Current Dose:22 Gy  Projected Dose:60 Gy   Narrative:  The patient presents for routine under treatment assessment.  CBCT/MVCT images/Port film x-rays were reviewed.  The chart was checked. More mouth pain and thick secretions. Followed up with nephrology and labs ok. Fever to 101.1 over the weekend. Now on Augmentin, mucinex and allegra. Used hydrocodone last night. Saw Conley Canal last week and nephrologist. Both are pleased.   Physical Findings:  Mucocitis vs. Thrush over entire oral mucosa.   Skin is slightly dark.   Vitals:  Filed Vitals:   08/01/14 1619  BP: 136/84  Pulse: 71  Temp: 98.1 F (36.7 C)   Weight:  Wt Readings from Last 3 Encounters:  08/01/14 156 lb 4.8 oz (70.897 kg)  07/18/14 162 lb 14.4 oz (73.891 kg)  06/23/14 159 lb 12.8 oz (72.485 kg)   Lab Results  Component Value Date   HGB 12.9* 01/07/2011   HCT 38.0* 01/07/2011   Lab Results  Component Value Date   CREATININE 1.30 01/07/2011   BUN 26* 01/07/2011   NA 137 01/07/2011   K 4.8 01/07/2011   CL 104 01/07/2011     Impression:  The patient is tolerating radiation.  Plan:  Continue treatment as planned. Will order weekly labs. Start humidifier, biotene, honey rinses. Topical lidocaine for oral mucosa. Diflucan (pt will check with nephrology re: renal dose) for possible thrush. Continue anitbiotics. Discussed using pain meds and increasing po. Discussed possible need for IVF and staying hydrated.

## 2014-08-01 NOTE — Progress Notes (Signed)
Patient here for weekly assessment of radiation to tongue/right neck.states he has had cold symptoms and low-grade temp up to 101.1 on Saturday and was started on augmentin bid for 7 days per [facility] doctor as well as mucinex and allegra.Has excess salivation and burning of the tongue.Patient has had a 5 lb wt.loss over the last 2 weeks.Side of tongue coated white.

## 2014-08-02 ENCOUNTER — Ambulatory Visit
Admission: RE | Admit: 2014-08-02 | Discharge: 2014-08-02 | Disposition: A | Discharge status: Home / Self Care | Payer: 59 | Source: Ambulatory Visit | Attending: Radiation Oncology | Admitting: Radiation Oncology

## 2014-08-02 ENCOUNTER — Encounter (INDEPENDENT_AMBULATORY_CARE_PROVIDER_SITE_OTHER): Payer: Self-pay

## 2014-08-02 ENCOUNTER — Encounter (HOSPITAL_COMMUNITY): Payer: Self-pay | Admitting: Dentistry

## 2014-08-02 ENCOUNTER — Ambulatory Visit (HOSPITAL_COMMUNITY): Payer: Medicaid - Dental | Admitting: Dentistry

## 2014-08-02 ENCOUNTER — Encounter: Payer: Self-pay | Admitting: Nutrition

## 2014-08-02 ENCOUNTER — Encounter: Payer: Self-pay | Admitting: Radiation Oncology

## 2014-08-02 VITALS — BP 135/85 | HR 72 | Temp 98.4°F | Wt 156.0 lb

## 2014-08-02 DIAGNOSIS — Z510 Encounter for antineoplastic radiation therapy: Secondary | ICD-10-CM | POA: Diagnosis not present

## 2014-08-02 DIAGNOSIS — C020 Malignant neoplasm of dorsal surface of tongue: Secondary | ICD-10-CM

## 2014-08-02 DIAGNOSIS — K1233 Oral mucositis (ulcerative) due to radiation: Secondary | ICD-10-CM

## 2014-08-02 DIAGNOSIS — K117 Disturbances of salivary secretion: Secondary | ICD-10-CM

## 2014-08-02 DIAGNOSIS — Y842 Radiological procedure and radiotherapy as the cause of abnormal reaction of the patient, or of later complication, without mention of misadventure at the time of the procedure: Secondary | ICD-10-CM

## 2014-08-02 DIAGNOSIS — R682 Dry mouth, unspecified: Secondary | ICD-10-CM

## 2014-08-02 DIAGNOSIS — Z0189 Encounter for other specified special examinations: Secondary | ICD-10-CM

## 2014-08-02 DIAGNOSIS — R1310 Dysphagia, unspecified: Secondary | ICD-10-CM

## 2014-08-02 NOTE — Patient Instructions (Signed)
RECOMMENDATIONS: 1. Brush after meals and at bedtime.  Use fluoride at bedtime. 2. Use trismus exercises as directed. 3. Use Biotene Rinse or salt water/baking soda rinses. 4. Multiple sips of water as needed. 5. Return to clinic in two months for oral exam after radiation therapy. Call if problems before then.  Ronald F. Kulinski, DDS   

## 2014-08-02 NOTE — Progress Notes (Signed)
I spoke to Dr. Mercy Moore today as well as one of the transplant pharmacist's at [facility].  The crux of the discussion was the balance between treating cancer and preserving the transplanted kidney in light of [name] significant mucositis and weight loss. We discussed the pros and cons of decreasing his immunosuppression and both recommended against decreasing his medications. Neither thought the cyclosporine was contributing to his mucositis as a radiation sensitizer and instead attributed his significant mucositis to chronic immunosuppression. Dr. Mercy Moore agreed to review his labs weekly and we will arrange to have those faxed. We also agreed to fax over his records regarding his cancer treatment as Dr. Mercy Moore was not clear on what his treatment course was. Dr. Mercy Moore was not concerned about cyclosporin levels and stated he would be in charge of monitoring those.  I discussed with him the [facility] pharmacist's recommendation to check those levels to keep him at 100-150 rather than 200. The transplant offered to see him for follow up if needed in clinic. Dr. Mercy Moore did not feel weekly visits with him were necessary and felt the weekly labs would be adequate. I have ordered weekly BMPs on Mondays. I also discussed the pros and cons of feeding tube placement with Dr. Mercy Moore. We discussed that typically in healthy patients a feeding tube was not placed for radiation alone given the decrease in swallowing function that can be seen in patients who use feeding tubes during treatment.  If his weight loss continues, we may have to place a feeding tube and I will discuss that with him next week. All manner of supportive care will continue to be offered including dietician monitoring, weekly fluids (NS with 20 meq of KCL per Dr. Mercy Moore) if needed, and pain medications. I will also ask physics to place a TLD for skin dose measurement to ensure his radiation dose is correct.

## 2014-08-02 NOTE — Progress Notes (Signed)
08/02/2014  Patient Name:   [name] Date of Birth:   [birth year] Medical Record Number: [id number]  BP 135/85 mmHg  Pulse 72  Temp(Src) 98.4 F (36.9 C) (Oral)  Wt 156 lb (70.761 kg)  [name] presents for oral examination during radiation therapy. Patient has completed 12/30 radiation treatments.  REVIEW OF CHIEF COMPLAINTS:  DRY MOUTH: Yes HARD TO SWALLOW: Yes  HURT TO SWALLOW: Yes TASTE CHANGES: No significant changes SORES IN MOUTH: Yes TRISMUS: No significant trismus. WEIGHT: 156 pounds  HOME OH REGIMEN:  BRUSHING: Daily FLOSSING: Daily RINSING: Salt water and baking soda rinses and Biotene rinses as needed. To start lidocaine rinse as needed for mucositis. FLUORIDE: Using fluoride at bedtime TRISMUS EXERCISES:  Maximum interincisal opening: 40 mm   DENTAL EXAM:  Oral Hygiene:(PLAQUE): Relatively good oral hygiene. LOCATION OF MUCOSITIS: Severe extensive mucositis with multiple ulcerations. DESCRIPTION OF SALIVA: Decreased. Thick saliva. ANY EXPOSED BONE: None noted OTHER WATCHED AREAS: Previous extraction sites. DX: Xerostomia, Dysphagia, Odynophagia and Mucositis  RECOMMENDATIONS: 1. Brush after meals and at bedtime.  Use fluoride at bedtime. 2. Use trismus exercises as directed. 3. Use Biotene Rinse or salt water/baking soda rinses.  4. Multiple sips of water as needed. 5. Return to clinic in two months for oral exam after radiation therapy. Call if problems before then.  Lenn Cal, DDS

## 2014-08-03 ENCOUNTER — Ambulatory Visit: Payer: 59 | Admitting: Nutrition

## 2014-08-03 ENCOUNTER — Ambulatory Visit
Admission: RE | Admit: 2014-08-03 | Discharge: 2014-08-03 | Disposition: A | Discharge status: Home / Self Care | Payer: 59 | Source: Ambulatory Visit | Attending: Radiation Oncology | Admitting: Radiation Oncology

## 2014-08-03 ENCOUNTER — Telehealth: Payer: Self-pay | Admitting: *Deleted

## 2014-08-03 ENCOUNTER — Encounter: Payer: Self-pay | Admitting: *Deleted

## 2014-08-03 DIAGNOSIS — C020 Malignant neoplasm of dorsal surface of tongue: Secondary | ICD-10-CM

## 2014-08-03 DIAGNOSIS — Z510 Encounter for antineoplastic radiation therapy: Secondary | ICD-10-CM | POA: Diagnosis not present

## 2014-08-03 NOTE — Telephone Encounter (Signed)
Returned patient wife's VM in which she asked if Dr. Pablo Ledger had spoken with [facility] pharmacist re: taking diflucan for thrush.  Subsequent to my review of Dr. Unknown Jim notes and my discussion with RN Val, I returned her call and indicated that he should not take diflucan until further clarification obtained, we would discuss further when he comes for RT today.  Wife also expressed concern about his chest congestion, requested "a good listen to his lungs".  I indicated I would join them when he comes for RT this afternoon and that he can be seen by RN and/or Dr. Pablo Ledger after RT.  Rn Val aware of conversation.  Gayleen Orem, RN, BSN, [place] at [place] [phone]

## 2014-08-03 NOTE — Progress Notes (Addendum)
Patient to nursing for orthostatic vitals and to reinforce what medication to take for oral mucositis.Knows not to take diflucan as incompatible with cyclosporine but may start nystatin which wife has picked up this afternoon.patient seen by nutritionist and will be followed closely.labs will be done at [place] [facility] on Monday 08/07/14 and faxed to my attention by Tomi Bamberger RN to avoid unnecessary stick.she knows that we ordered bmet.Patient has cough which seems to happen more with swallowing with excess salivation.Lungs clear.

## 2014-08-03 NOTE — Progress Notes (Signed)
Nutrition follow-up completed with patient and wife. Patient follows a vegetarian diet. Patient reports he has developed a cold exacerbating nutrition impact symptoms from radiation therapy. Patient has developed mouth sores and mucositis. He is trying to drink at least 1 protein shake a day. Wife is pureing meals and snacks. They are interested in a "shot" of high protein oral supplement. Weight decreased and documented as 156 pounds January 27, down from 159.8 pounds December 18.  Nutrition diagnosis: Food and nutrition related knowledge deficit continues.  Intervention: Patient educated to continue high-calorie, high-protein meals and snacks.  Recommended patient utilize oral nutrition supplements with higher calories and protein such as ENU.Marland Kitchen Provided patient with options for additional protein, including Unjury protein powder and Protostat. Provided several cookbooks for wife to gather ideas. Questions were answered.  Teach back method used.  Monitoring, evaluation, goals: Patient will work to increase overall intake of calories and protein to minimize further weight loss.  Next visit: Thursday February 4 before radiation treatment.  **Disclaimer: This note was dictated with voice recognition software. Similar sounding words can inadvertently be transcribed and this note may contain transcription errors which may not have been corrected upon publication of note.**

## 2014-08-03 NOTE — Progress Notes (Signed)
Weekly Management Note Current Dose: 26Gy  Projected Dose:60 Gy   Narrative:  The patient presents for routine under treatment assessment.  CBCT/MVCT images/Port film x-rays were reviewed.  The chart was checked. Mucositis continues. Not using lidocaine. Picked up nystatin. Mercy Moore will be drawing labs on Mondays. Feels cold and cough is worse in mornings. No fever.   Physical Findings:  Unchanged. Mucositis over oropharynx and oral cavity. .   Vitals:  Wt Readings from Last 3 Encounters:  08/03/14 153 lb 8 oz (69.627 kg)  08/02/14 156 lb (70.761 kg)  08/01/14 156 lb 4.8 oz (70.897 kg)   Temp Readings from Last 3 Encounters:  08/03/14 98.6 F (37 C)   08/02/14 98.4 F (36.9 C) Oral  08/01/14 98.1 F (36.7 C)    BP Readings from Last 3 Encounters:  08/03/14 123/81  08/02/14 135/85  08/01/14 136/84    Lab Results  Component Value Date   HGB 12.9* 01/07/2011   HCT 38.0* 01/07/2011   Lab Results  Component Value Date   CREATININE 1.30 01/07/2011   BUN 26* 01/07/2011   NA 137 01/07/2011   K 4.8 01/07/2011   CL 104 01/07/2011     Impression:  The patient is tolerating radiation.  Plan:  Continue treatment as planned. IVF tomorrow 9!L NS with 20 meq KCL over 3 hours). Due to orthostatic BP today and poor po intake. Encouraged pos. Discussed nystatin swish and spit. Labs will be requested form this week at Dr. Etheleen Nicks office. Labs Monday. Patient declines pain medications.

## 2014-08-04 ENCOUNTER — Ambulatory Visit: Payer: 59

## 2014-08-04 ENCOUNTER — Ambulatory Visit
Admission: RE | Admit: 2014-08-04 | Discharge: 2014-08-04 | Disposition: A | Discharge status: Home / Self Care | Payer: 59 | Source: Ambulatory Visit | Attending: Radiation Oncology | Admitting: Radiation Oncology

## 2014-08-04 VITALS — BP 124/77 | HR 59 | Temp 98.2°F | Resp 18

## 2014-08-04 DIAGNOSIS — Z510 Encounter for antineoplastic radiation therapy: Secondary | ICD-10-CM | POA: Diagnosis not present

## 2014-08-04 DIAGNOSIS — C020 Malignant neoplasm of dorsal surface of tongue: Secondary | ICD-10-CM

## 2014-08-04 MED ORDER — SODIUM CHLORIDE 0.9 % IV SOLN
Freq: Once | INTRAVENOUS | Status: AC
  Administered 2014-08-04: 15:00:00 via INTRAVENOUS
  Filled 2014-08-04: qty 1000

## 2014-08-04 NOTE — Progress Notes (Signed)
To provide support and encouragement, care continuity and to assess for needs, met with patient during tomo tmt.  He was accompanied by his wife. Escorted him to Nursing, conducted orthostatic VS, reported them to RN Val. He reported he is feeling increasingly fatigued as tmts proceed.  He understands that this is an expected SE of RT. They understand I can be contacted with concerns/needs.  Gayleen Orem, RN, BSN, [place] at [place] [phone]

## 2014-08-04 NOTE — Patient Instructions (Signed)
Dehydration, Adult Dehydration is when you lose more fluids from the body than you take in. Vital organs like the kidneys, brain, and heart cannot function without a proper amount of fluids and salt. Any loss of fluids from the body can cause dehydration.  CAUSES   Vomiting.  Diarrhea.  Excessive sweating.  Excessive urine output.  Fever. SYMPTOMS  Mild dehydration  Thirst.  Dry lips.  Slightly dry mouth. Moderate dehydration  Very dry mouth.  Sunken eyes.  Skin does not bounce back quickly when lightly pinched and released.  Dark urine and decreased urine production.  Decreased tear production.  Headache. Severe dehydration  Very dry mouth.  Extreme thirst.  Rapid, weak pulse (more than 100 beats per minute at rest).  Cold hands and feet.  Not able to sweat in spite of heat and temperature.  Rapid breathing.  Blue lips.  Confusion and lethargy.  Difficulty being awakened.  Minimal urine production.  No tears. DIAGNOSIS  Your caregiver will diagnose dehydration based on your symptoms and your exam. Blood and urine tests will help confirm the diagnosis. The diagnostic evaluation should also identify the cause of dehydration. TREATMENT  Treatment of mild or moderate dehydration can often be done at home by increasing the amount of fluids that you drink. It is best to drink small amounts of fluid more often. Drinking too much at one time can make vomiting worse. Refer to the home care instructions below. Severe dehydration needs to be treated at the hospital where you will probably be given intravenous (IV) fluids that contain water and electrolytes. HOME CARE INSTRUCTIONS   Ask your caregiver about specific rehydration instructions.  Drink enough fluids to keep your urine clear or pale yellow.  Drink small amounts frequently if you have nausea and vomiting.  Eat as you normally do.  Avoid:  Foods or drinks high in sugar.  Carbonated  drinks.  Juice.  Extremely hot or cold fluids.  Drinks with caffeine.  Fatty, greasy foods.  Alcohol.  Tobacco.  Overeating.  Gelatin desserts.  Wash your hands well to avoid spreading bacteria and viruses.  Only take over-the-counter or prescription medicines for pain, discomfort, or fever as directed by your caregiver.  Ask your caregiver if you should continue all prescribed and over-the-counter medicines.  Keep all follow-up appointments with your caregiver. SEEK MEDICAL CARE IF:  You have abdominal pain and it increases or stays in one area (localizes).  You have a rash, stiff neck, or severe headache.  You are irritable, sleepy, or difficult to awaken.  You are weak, dizzy, or extremely thirsty. SEEK IMMEDIATE MEDICAL CARE IF:   You are unable to keep fluids down or you get worse despite treatment.  You have frequent episodes of vomiting or diarrhea.  You have blood or green matter (bile) in your vomit.  You have blood in your stool or your stool looks black and tarry.  You have not urinated in 6 to 8 hours, or you have only urinated a small amount of very dark urine.  You have a fever.  You faint. MAKE SURE YOU:   Understand these instructions.  Will watch your condition.  Will get help right away if you are not doing well or get worse. Document Released: 06/23/2005 Document Revised: 09/15/2011 Document Reviewed: 02/10/2011 ExitCare Patient Information 2015 [employer]. This information is not intended to replace advice given to you by your health care provider. Make sure you discuss any questions you have with your health care   provider.  

## 2014-08-07 ENCOUNTER — Ambulatory Visit
Admission: RE | Admit: 2014-08-07 | Discharge: 2014-08-07 | Disposition: A | Discharge status: Home / Self Care | Payer: 59 | Source: Ambulatory Visit | Attending: Radiation Oncology | Admitting: Radiation Oncology

## 2014-08-07 ENCOUNTER — Ambulatory Visit: Payer: 59

## 2014-08-07 DIAGNOSIS — Z510 Encounter for antineoplastic radiation therapy: Secondary | ICD-10-CM | POA: Diagnosis not present

## 2014-08-08 ENCOUNTER — Ambulatory Visit
Admission: RE | Admit: 2014-08-08 | Discharge: 2014-08-08 | Disposition: A | Discharge status: Home / Self Care | Payer: 59 | Source: Ambulatory Visit | Attending: Radiation Oncology | Admitting: Radiation Oncology

## 2014-08-08 DIAGNOSIS — Z510 Encounter for antineoplastic radiation therapy: Secondary | ICD-10-CM | POA: Diagnosis not present

## 2014-08-09 ENCOUNTER — Encounter: Payer: Self-pay | Admitting: Radiation Oncology

## 2014-08-09 ENCOUNTER — Encounter: Payer: Self-pay | Admitting: *Deleted

## 2014-08-09 ENCOUNTER — Ambulatory Visit
Admission: RE | Admit: 2014-08-09 | Discharge: 2014-08-09 | Disposition: A | Discharge status: Home / Self Care | Payer: 59 | Source: Ambulatory Visit | Attending: Radiation Oncology | Admitting: Radiation Oncology

## 2014-08-09 VITALS — BP 114/74 | HR 88 | Temp 98.8°F | Ht 72.0 in | Wt 151.9 lb

## 2014-08-09 DIAGNOSIS — C020 Malignant neoplasm of dorsal surface of tongue: Secondary | ICD-10-CM

## 2014-08-09 DIAGNOSIS — Z510 Encounter for antineoplastic radiation therapy: Secondary | ICD-10-CM | POA: Diagnosis not present

## 2014-08-09 NOTE — Progress Notes (Signed)
Weekly Management Note Current Dose:34 Gy  Projected Dose:60 Gy   Narrative:  The patient presents for routine under treatment assessment.  CBCT/MVCT images/Port film x-rays were reviewed.  The chart was checked.  Cold improving. Felt better after fluids. Improving oral intake. Working hard on protein and calories. Labs drawn today.   Physical Findings:  Mucositis. Skin is pink. Weight stable from last week.   Vitals:  Filed Vitals:   08/09/14 1613  BP: 114/74  Pulse: 88  Temp:    Weight: 152 today.  Wt Readings from Last 3 Encounters:  08/03/14 153 lb 8 oz (69.627 kg)  08/02/14 156 lb (70.761 kg)  08/01/14 156 lb 4.8 oz (70.897 kg)   Lab Results  Component Value Date   HGB 12.9* 01/07/2011   HCT 38.0* 01/07/2011   Lab Results  Component Value Date   CREATININE 1.30 01/07/2011   BUN 26* 01/07/2011   NA 137 01/07/2011   K 4.8 01/07/2011   CL 104 01/07/2011     Impression:  The patient is tolerating radiation.  Plan:  Continue treatment as planned. IVF on Friday. Will follow up on labs tomorrow.

## 2014-08-09 NOTE — Progress Notes (Signed)
Mr. Lienhard has received 17 fractions to his right neck.  He c/o level 4 pain upon swallowing.  He is eating pureed foods.  He has lost ~ 2 lbs since 08/03/14.  His oral mucosa is slightly red and without any signs of bleeding.   Note erythema of his jaw line, but skin remains intact.  He admits to fatigue and is taking more frequent naps.

## 2014-08-10 ENCOUNTER — Ambulatory Visit
Admission: RE | Admit: 2014-08-10 | Discharge: 2014-08-10 | Disposition: A | Discharge status: Home / Self Care | Payer: 59 | Source: Ambulatory Visit | Attending: Radiation Oncology | Admitting: Radiation Oncology

## 2014-08-10 ENCOUNTER — Encounter: Payer: Self-pay | Admitting: Nutrition

## 2014-08-10 ENCOUNTER — Telehealth: Payer: Self-pay | Admitting: *Deleted

## 2014-08-10 DIAGNOSIS — Z510 Encounter for antineoplastic radiation therapy: Secondary | ICD-10-CM | POA: Diagnosis not present

## 2014-08-10 NOTE — Telephone Encounter (Signed)
Val from radiation called and scheduled appt for tomorrow. She will call the paitent

## 2014-08-10 NOTE — Progress Notes (Signed)
To provide support and encouragement, care continuity and to assess for needs, met with patient during Weekly UT with Dr. Pablo Ledger.  His wife accompanied him. 1. He reported:  Nystatin is helping with mouth/tongue soreness.  Not using lidocaine b/c he does not want to unknowingly bite his tongue while eating.  Eating pureed foods/soups BID.  Drinking 2 cans Boost daily, milk.  Continues to work half days.  He acknowledged that he may stay home in final weeks of tmt. 2. He understands IVF are being scheduled for Friday. 3. He understands he can contact me with needs/concerns.  Gayleen Orem, RN, BSN, [place] at [place] [phone]

## 2014-08-11 ENCOUNTER — Ambulatory Visit
Admission: RE | Admit: 2014-08-11 | Discharge: 2014-08-11 | Disposition: A | Discharge status: Home / Self Care | Payer: 59 | Source: Ambulatory Visit | Attending: Radiation Oncology | Admitting: Radiation Oncology

## 2014-08-11 ENCOUNTER — Ambulatory Visit: Payer: 59

## 2014-08-11 VITALS — BP 145/77 | HR 61 | Temp 98.2°F | Resp 20

## 2014-08-11 DIAGNOSIS — Z510 Encounter for antineoplastic radiation therapy: Secondary | ICD-10-CM | POA: Diagnosis not present

## 2014-08-11 MED ORDER — POTASSIUM CHLORIDE 2 MEQ/ML IV SOLN
Freq: Once | INTRAVENOUS | Status: AC
  Administered 2014-08-11: 16:00:00 via INTRAVENOUS
  Filled 2014-08-11: qty 1000

## 2014-08-11 NOTE — Patient Instructions (Signed)
Dehydration, Adult Dehydration is when you lose more fluids from the body than you take in. Vital organs like the kidneys, brain, and heart cannot function without a proper amount of fluids and salt. Any loss of fluids from the body can cause dehydration.  CAUSES   Vomiting.  Diarrhea.  Excessive sweating.  Excessive urine output.  Fever. SYMPTOMS  Mild dehydration  Thirst.  Dry lips.  Slightly dry mouth. Moderate dehydration  Very dry mouth.  Sunken eyes.  Skin does not bounce back quickly when lightly pinched and released.  Dark urine and decreased urine production.  Decreased tear production.  Headache. Severe dehydration  Very dry mouth.  Extreme thirst.  Rapid, weak pulse (more than 100 beats per minute at rest).  Cold hands and feet.  Not able to sweat in spite of heat and temperature.  Rapid breathing.  Blue lips.  Confusion and lethargy.  Difficulty being awakened.  Minimal urine production.  No tears. DIAGNOSIS  Your caregiver will diagnose dehydration based on your symptoms and your exam. Blood and urine tests will help confirm the diagnosis. The diagnostic evaluation should also identify the cause of dehydration. TREATMENT  Treatment of mild or moderate dehydration can often be done at home by increasing the amount of fluids that you drink. It is best to drink small amounts of fluid more often. Drinking too much at one time can make vomiting worse. Refer to the home care instructions below. Severe dehydration needs to be treated at the hospital where you will probably be given intravenous (IV) fluids that contain water and electrolytes. HOME CARE INSTRUCTIONS   Ask your caregiver about specific rehydration instructions.  Drink enough fluids to keep your urine clear or pale yellow.  Drink small amounts frequently if you have nausea and vomiting.  Eat as you normally do.  Avoid:  Foods or drinks high in sugar.  Carbonated  drinks.  Juice.  Extremely hot or cold fluids.  Drinks with caffeine.  Fatty, greasy foods.  Alcohol.  Tobacco.  Overeating.  Gelatin desserts.  Wash your hands well to avoid spreading bacteria and viruses.  Only take over-the-counter or prescription medicines for pain, discomfort, or fever as directed by your caregiver.  Ask your caregiver if you should continue all prescribed and over-the-counter medicines.  Keep all follow-up appointments with your caregiver. SEEK MEDICAL CARE IF:  You have abdominal pain and it increases or stays in one area (localizes).  You have a rash, stiff neck, or severe headache.  You are irritable, sleepy, or difficult to awaken.  You are weak, dizzy, or extremely thirsty. SEEK IMMEDIATE MEDICAL CARE IF:   You are unable to keep fluids down or you get worse despite treatment.  You have frequent episodes of vomiting or diarrhea.  You have blood or green matter (bile) in your vomit.  You have blood in your stool or your stool looks black and tarry.  You have not urinated in 6 to 8 hours, or you have only urinated a small amount of very dark urine.  You have a fever.  You faint. MAKE SURE YOU:   Understand these instructions.  Will watch your condition.  Will get help right away if you are not doing well or get worse. Document Released: 06/23/2005 Document Revised: 09/15/2011 Document Reviewed: 02/10/2011 ExitCare Patient Information 2015 [employer]. This information is not intended to replace advice given to you by your health care provider. Make sure you discuss any questions you have with your health care   provider.  

## 2014-08-11 NOTE — Progress Notes (Signed)
1827 discharged ambulatory with spouse in no distress.

## 2014-08-14 ENCOUNTER — Telehealth: Payer: Self-pay | Admitting: *Deleted

## 2014-08-14 ENCOUNTER — Ambulatory Visit
Admission: RE | Admit: 2014-08-14 | Discharge: 2014-08-14 | Disposition: A | Discharge status: Home / Self Care | Payer: 59 | Source: Ambulatory Visit | Attending: Radiation Oncology | Admitting: Radiation Oncology

## 2014-08-14 ENCOUNTER — Ambulatory Visit: Payer: 59

## 2014-08-14 ENCOUNTER — Telehealth: Payer: Self-pay

## 2014-08-14 DIAGNOSIS — Z510 Encounter for antineoplastic radiation therapy: Secondary | ICD-10-CM | POA: Diagnosis not present

## 2014-08-14 NOTE — Telephone Encounter (Signed)
Spoke with patient regarding status.He states he did not take in as many fluids this week-end since he had iv fluids on Friday.Told patient he needs to still push fluids po regardless of having fluids.He states he will be having labs via kidney center on Tuesday 08/15/14 and have results faxed to [place].I  will call Center to verify as we already have patient scheduled for weekly labs on Monday.Patient to come to nursing for assessment when he comes in for treatment today.

## 2014-08-14 NOTE — Telephone Encounter (Signed)
Patient called to report:  Orthostatic VS taken this morning:  Sitting - P 109, BP 117/70; Standing - P 117, BP 80/60.    Feels dizzy when he stands too quickly.  VS on Sat were "good" s/p the IVF he received Friday afternoon.    Suspects that he may need fluids twice weekly.  He is can come in today if necessary. Dr. Sondra Come notified (Dr. Pablo Ledger off), as well as RN Val.  Gayleen Orem, RN, BSN, [place] at [place] [phone]

## 2014-08-15 ENCOUNTER — Ambulatory Visit
Admission: RE | Admit: 2014-08-15 | Discharge: 2014-08-15 | Disposition: A | Discharge status: Home / Self Care | Payer: 59 | Source: Ambulatory Visit | Attending: Radiation Oncology | Admitting: Radiation Oncology

## 2014-08-15 ENCOUNTER — Ambulatory Visit: Payer: 59

## 2014-08-15 ENCOUNTER — Telehealth: Payer: Self-pay | Admitting: *Deleted

## 2014-08-15 ENCOUNTER — Other Ambulatory Visit: Payer: Self-pay | Admitting: Radiation Oncology

## 2014-08-15 VITALS — BP 119/76 | HR 79 | Temp 98.2°F | Resp 18

## 2014-08-15 VITALS — BP 119/76 | HR 79 | Temp 98.2°F | Wt 148.6 lb

## 2014-08-15 DIAGNOSIS — C020 Malignant neoplasm of dorsal surface of tongue: Secondary | ICD-10-CM | POA: Insufficient documentation

## 2014-08-15 DIAGNOSIS — E860 Dehydration: Secondary | ICD-10-CM

## 2014-08-15 DIAGNOSIS — Z510 Encounter for antineoplastic radiation therapy: Secondary | ICD-10-CM | POA: Insufficient documentation

## 2014-08-15 MED ORDER — SODIUM CHLORIDE 0.9 % IV SOLN
Freq: Once | INTRAVENOUS | Status: AC
  Administered 2014-08-15: 17:00:00 via INTRAVENOUS

## 2014-08-15 MED ORDER — SODIUM CHLORIDE 0.9 % IV SOLN
Freq: Once | INTRAVENOUS | Status: DC
  Administered 2014-08-15: 15:00:00 via INTRAVENOUS
  Filled 2014-08-15: qty 1000

## 2014-08-15 NOTE — Telephone Encounter (Signed)
CALLED PATIENT TO INFORM OF NEEDING TO COME IN TODAY FOR IV FLUIDS, PATIENT UNDERSTOOD THIS, BUT HE SAID THAT HE MIGHT BE LATE GETTING HERE.

## 2014-08-15 NOTE — Progress Notes (Signed)
Weekly Management Note Current Dose:42 Gy  Projected Dose: 60 Gy   Narrative:  The patient presents for routine under treatment assessment.  CBCT/MVCT images/Port film x-rays were reviewed.  The chart was checked. Doing well. Still taking pain medication at night. Labs show elevated BUn, stable creatinine and stable potassium. Weight is down 1.5 pounds. Accompanied by son and daughter in law. Considering not working next week. Fluids today due to decreased po over the weekend.   Physical Findings:  mucocitis continues in mouth. Skin is pink over face and neck. Dry desquamation under neck.   Vitals:  Filed Vitals:   08/15/14 1500  BP: 119/76  Pulse: 79  Temp: 98.2 F (36.8 C)   Weight:  Wt Readings from Last 3 Encounters:  08/15/14 148 lb 9.6 oz (67.405 kg)  08/14/14 149 lb 8 oz (67.813 kg)  08/09/14 151 lb 14.4 oz (68.901 kg)   Lab Results  Component Value Date   HGB 12.9* 01/07/2011   HCT 38.0* 01/07/2011   Lab Results  Component Value Date   CREATININE 1.30 01/07/2011   BUN 26* 01/07/2011   NA 137 01/07/2011   K 4.8 01/07/2011   CL 104 01/07/2011     Impression:  The patient is tolerating radiation.  Plan:  Continue treatment as planned. IVF today (NS- 1L).  Encouraged po and pain medications. Copy of labs given to patient.

## 2014-08-16 ENCOUNTER — Ambulatory Visit
Admission: RE | Admit: 2014-08-16 | Discharge: 2014-08-16 | Disposition: A | Discharge status: Home / Self Care | Payer: 59 | Source: Ambulatory Visit | Attending: Radiation Oncology | Admitting: Radiation Oncology

## 2014-08-16 DIAGNOSIS — Z510 Encounter for antineoplastic radiation therapy: Secondary | ICD-10-CM | POA: Diagnosis not present

## 2014-08-17 ENCOUNTER — Telehealth: Payer: Self-pay | Admitting: *Deleted

## 2014-08-17 ENCOUNTER — Other Ambulatory Visit: Payer: Self-pay | Admitting: Radiation Oncology

## 2014-08-17 ENCOUNTER — Ambulatory Visit
Admission: RE | Admit: 2014-08-17 | Discharge: 2014-08-17 | Disposition: A | Discharge status: Home / Self Care | Payer: 59 | Source: Ambulatory Visit | Attending: Radiation Oncology | Admitting: Radiation Oncology

## 2014-08-17 DIAGNOSIS — Z510 Encounter for antineoplastic radiation therapy: Secondary | ICD-10-CM | POA: Diagnosis not present

## 2014-08-17 DIAGNOSIS — C020 Malignant neoplasm of dorsal surface of tongue: Secondary | ICD-10-CM

## 2014-08-17 MED ORDER — SODIUM CHLORIDE 0.9 % IV SOLN: Freq: Once | INTRAVENOUS | Status: DC

## 2014-08-17 NOTE — Telephone Encounter (Signed)
Called patient to let him know that Dr.Wentworth has placed order for him to receive IVF tomorrow and that he will be notified in the morning with the appt time.  Gayleen Orem, RN, BSN, [place] at [place] [phone]

## 2014-08-17 NOTE — Telephone Encounter (Signed)
Patient called with indication he understood he would be receiving IVF tomorrow but it's not on his schedule.  He asked for follow-up, I notified Dr. Pablo Ledger and RN Val.  Gayleen Orem, RN, BSN, [place] at [place] [phone]

## 2014-08-18 ENCOUNTER — Ambulatory Visit: Payer: 59

## 2014-08-18 ENCOUNTER — Ambulatory Visit
Admission: RE | Admit: 2014-08-18 | Discharge: 2014-08-18 | Disposition: A | Discharge status: Home / Self Care | Payer: 59 | Source: Ambulatory Visit | Attending: Radiation Oncology | Admitting: Radiation Oncology

## 2014-08-18 VITALS — BP 128/70 | HR 72 | Temp 97.4°F | Resp 20

## 2014-08-18 DIAGNOSIS — Z510 Encounter for antineoplastic radiation therapy: Secondary | ICD-10-CM | POA: Diagnosis not present

## 2014-08-18 DIAGNOSIS — E860 Dehydration: Secondary | ICD-10-CM

## 2014-08-18 MED ORDER — SODIUM CHLORIDE 0.9 % IV SOLN
1000.0000 mL | Freq: Once | INTRAVENOUS | Status: AC
  Administered 2014-08-18: 14:00:00 via INTRAVENOUS

## 2014-08-18 NOTE — Patient Instructions (Signed)

## 2014-08-18 NOTE — Progress Notes (Signed)
Patient ambulates well, with no signs or symptoms of dizziness or distress noted; post vital signs stable; discharge home with wife and no complaints

## 2014-08-21 ENCOUNTER — Ambulatory Visit: Payer: 59 | Attending: Radiation Oncology

## 2014-08-21 ENCOUNTER — Ambulatory Visit
Admission: RE | Admit: 2014-08-21 | Discharge: 2014-08-21 | Disposition: A | Discharge status: Home / Self Care | Payer: 59 | Source: Ambulatory Visit | Attending: Radiation Oncology | Admitting: Radiation Oncology

## 2014-08-21 DIAGNOSIS — C020 Malignant neoplasm of dorsal surface of tongue: Secondary | ICD-10-CM

## 2014-08-21 DIAGNOSIS — Z510 Encounter for antineoplastic radiation therapy: Secondary | ICD-10-CM | POA: Diagnosis not present

## 2014-08-21 MED ORDER — BIAFINE EX EMUL
Freq: Two times a day (BID) | CUTANEOUS | Status: DC
  Administered 2014-08-21: 11:00:00 via TOPICAL

## 2014-08-22 ENCOUNTER — Ambulatory Visit
Admission: RE | Admit: 2014-08-22 | Discharge: 2014-08-22 | Disposition: A | Discharge status: Home / Self Care | Payer: 59 | Source: Ambulatory Visit | Attending: Radiation Oncology | Admitting: Radiation Oncology

## 2014-08-22 ENCOUNTER — Ambulatory Visit: Payer: 59 | Admitting: Nutrition

## 2014-08-22 ENCOUNTER — Encounter: Payer: Self-pay | Admitting: Radiation Oncology

## 2014-08-22 ENCOUNTER — Other Ambulatory Visit: Payer: Self-pay | Admitting: Radiation Oncology

## 2014-08-22 ENCOUNTER — Ambulatory Visit: Payer: 59

## 2014-08-22 VITALS — BP 144/83 | HR 77 | Temp 98.9°F | Resp 18

## 2014-08-22 VITALS — BP 135/85 | HR 86 | Temp 98.7°F | Resp 20 | Wt 143.7 lb

## 2014-08-22 DIAGNOSIS — C020 Malignant neoplasm of dorsal surface of tongue: Secondary | ICD-10-CM

## 2014-08-22 DIAGNOSIS — Z510 Encounter for antineoplastic radiation therapy: Secondary | ICD-10-CM | POA: Diagnosis not present

## 2014-08-22 MED ORDER — HYDROCODONE-ACETAMINOPHEN 5-325 MG PO TABS
2.0000 | ORAL_TABLET | Freq: Once | ORAL | Status: AC
  Administered 2014-08-22: 2 via ORAL

## 2014-08-22 MED ORDER — SODIUM CHLORIDE 0.9 % IV SOLN
Freq: Once | INTRAVENOUS | Status: AC
  Administered 2014-08-22: 16:00:00 via INTRAVENOUS

## 2014-08-22 MED ORDER — HYDROCODONE-ACETAMINOPHEN 5-325 MG PO TABS
ORAL_TABLET | ORAL | Status: AC
  Filled 2014-08-22: qty 2

## 2014-08-22 NOTE — Progress Notes (Signed)
Patient has completed 26 out of 30 radiation therapy treatments. Patient is receiving IV fluids today secondary to poor oral intake. Weight is down 16.1 pounds overall.  Patient weighed 143.7 pounds today, down from 151.9 pounds February 3. Patient was consuming some blenderized foods but is no longer able to do so. Patient consumes 2 boost plus and 2 "milkshakes" daily.   Patient has not been using pain medication or lidocaine. He can no longer tolerate Protostat.  Estimated nutrition needs: 2200-2400 cal, 78-95 g protein, 2.4 L fluid.  Nutrition diagnosis: Food and nutrition related knowledge deficit continues.  Intervention: Educated patient to increase boost plus to a minimum of 6 daily. Recommended patient could substitute enu oral nutrition supplements and consume 4 times a day.  Worked with patient to try different temperatures of liquids.  Appears patient tolerates warm liquids or room temperature liquids best. Patient seems to tolerate thin liquids better than thick liquids. Stressed importance of patient taking pain medication and lidocaine to assist with increasing oral intake. Teach back method used.  Monitoring, evaluation, and goals: Patient has been unable to increase oral intake and had severe weight loss. He will work to increase oral nutrition supplements to minimize further weight loss.  Next visit: Thursday, February 4, before radiation therapy.  **Disclaimer: This note was dictated with voice recognition software. Similar sounding words can inadvertently be transcribed and this note may contain transcription errors which may not have been corrected upon publication of note.**

## 2014-08-22 NOTE — Progress Notes (Signed)
Weekly Management Note Current Dose:52 Gy  Projected Dose:60 Gy   Narrative:  The patient presents for routine under treatment assessment.  CBCT/MVCT images/Port film x-rays were reviewed.  The chart was checked. More mucocitis and pain over past 2 days along with skin peeling.  Only take pain medications at night still and drinking 2 muscle milks and 2 boost drinks. Wife's father passed away in [place] so she is there taking care of funeral arrangements. Using biafene.   Physical Findings:  Unchanged  Vitals:  Filed Vitals:   08/22/14 1455  BP: 135/85  Pulse: 86  Temp: 98.7 F (37.1 C)  Resp: 20   Weight:  Wt Readings from Last 3 Encounters:  08/22/14 143 lb 11.2 oz (65.182 kg)  08/15/14 148 lb 9.6 oz (67.405 kg)  08/14/14 149 lb 8 oz (67.813 kg)   Lab Results  Component Value Date   HGB 12.9* 01/07/2011   HCT 38.0* 01/07/2011   Labs from nephrology: Glucose 121, BUN 29, CR 1.0, NA 132 K 4.6, CL 95.    Impression:  The patient is tolerating radiation.  Plan:  Continue treatment as planned. Encouraged PO intake. Discussed upping pain medications and continuing biafene. IVF today and Friday.

## 2014-08-22 NOTE — Progress Notes (Addendum)
Weekly rad txs right neck 26/30 completed, dry desquamation on neck, erythema,, lips swollen,  using biafine 2-3x day, c/o difficulty swallowing now sore throat 8/10 when swallowing anything, has ulcers/mucositis on tongue and mouth, drinks only boost , no solid foods, stated, has IVF'S appt here today after MD visit, only takes pain med at night stated 3:00 PM  protein d

## 2014-08-22 NOTE — Patient Instructions (Signed)
Dehydration, Adult Dehydration is when you lose more fluids from the body than you take in. Vital organs like the kidneys, brain, and heart cannot function without a proper amount of fluids and salt. Any loss of fluids from the body can cause dehydration.  CAUSES   Vomiting.  Diarrhea.  Excessive sweating.  Excessive urine output.  Fever. SYMPTOMS  Mild dehydration  Thirst.  Dry lips.  Slightly dry mouth. Moderate dehydration  Very dry mouth.  Sunken eyes.  Skin does not bounce back quickly when lightly pinched and released.  Dark urine and decreased urine production.  Decreased tear production.  Headache. Severe dehydration  Very dry mouth.  Extreme thirst.  Rapid, weak pulse (more than 100 beats per minute at rest).  Cold hands and feet.  Not able to sweat in spite of heat and temperature.  Rapid breathing.  Blue lips.  Confusion and lethargy.  Difficulty being awakened.  Minimal urine production.  No tears. DIAGNOSIS  Your caregiver will diagnose dehydration based on your symptoms and your exam. Blood and urine tests will help confirm the diagnosis. The diagnostic evaluation should also identify the cause of dehydration. TREATMENT  Treatment of mild or moderate dehydration can often be done at home by increasing the amount of fluids that you drink. It is best to drink small amounts of fluid more often. Drinking too much at one time can make vomiting worse. Refer to the home care instructions below. Severe dehydration needs to be treated at the hospital where you will probably be given intravenous (IV) fluids that contain water and electrolytes. HOME CARE INSTRUCTIONS   Ask your caregiver about specific rehydration instructions.  Drink enough fluids to keep your urine clear or pale yellow.  Drink small amounts frequently if you have nausea and vomiting.  Eat as you normally do.  Avoid:  Foods or drinks high in sugar.  Carbonated  drinks.  Juice.  Extremely hot or cold fluids.  Drinks with caffeine.  Fatty, greasy foods.  Alcohol.  Tobacco.  Overeating.  Gelatin desserts.  Wash your hands well to avoid spreading bacteria and viruses.  Only take over-the-counter or prescription medicines for pain, discomfort, or fever as directed by your caregiver.  Ask your caregiver if you should continue all prescribed and over-the-counter medicines.  Keep all follow-up appointments with your caregiver. SEEK MEDICAL CARE IF:  You have abdominal pain and it increases or stays in one area (localizes).  You have a rash, stiff neck, or severe headache.  You are irritable, sleepy, or difficult to awaken.  You are weak, dizzy, or extremely thirsty. SEEK IMMEDIATE MEDICAL CARE IF:   You are unable to keep fluids down or you get worse despite treatment.  You have frequent episodes of vomiting or diarrhea.  You have blood or green matter (bile) in your vomit.  You have blood in your stool or your stool looks black and tarry.  You have not urinated in 6 to 8 hours, or you have only urinated a small amount of very dark urine.  You have a fever.  You faint. MAKE SURE YOU:   Understand these instructions.  Will watch your condition.  Will get help right away if you are not doing well or get worse. Document Released: 06/23/2005 Document Revised: 09/15/2011 Document Reviewed: 02/10/2011 ExitCare Patient Information 2015 [employer]. This information is not intended to replace advice given to you by your health care provider. Make sure you discuss any questions you have with your health care   provider.  

## 2014-08-23 ENCOUNTER — Telehealth: Payer: Self-pay | Admitting: *Deleted

## 2014-08-23 ENCOUNTER — Ambulatory Visit
Admission: RE | Admit: 2014-08-23 | Discharge: 2014-08-23 | Disposition: A | Discharge status: Home / Self Care | Payer: 59 | Source: Ambulatory Visit | Attending: Radiation Oncology | Admitting: Radiation Oncology

## 2014-08-23 ENCOUNTER — Other Ambulatory Visit: Payer: Self-pay | Admitting: Radiation Oncology

## 2014-08-23 DIAGNOSIS — C020 Malignant neoplasm of dorsal surface of tongue: Secondary | ICD-10-CM

## 2014-08-23 DIAGNOSIS — Z510 Encounter for antineoplastic radiation therapy: Secondary | ICD-10-CM | POA: Diagnosis not present

## 2014-08-23 NOTE — Telephone Encounter (Signed)
Per Malachy Mood in radiation I have scheduled appt for Friday. She will call the patient

## 2014-08-23 NOTE — Telephone Encounter (Addendum)
Mr. Loflin called requesting that his IVF hydration start immediately after his 1 pm treatment on Friday.  The first available time is 1:45pm which was acceptable to him.   Order to be placed by Dr. Pablo Ledger.

## 2014-08-24 ENCOUNTER — Other Ambulatory Visit: Payer: Self-pay | Admitting: Radiation Oncology

## 2014-08-24 ENCOUNTER — Ambulatory Visit
Admission: RE | Admit: 2014-08-24 | Discharge: 2014-08-24 | Disposition: A | Discharge status: Home / Self Care | Payer: 59 | Source: Ambulatory Visit | Attending: Radiation Oncology | Admitting: Radiation Oncology

## 2014-08-24 ENCOUNTER — Encounter: Payer: Self-pay | Admitting: Nutrition

## 2014-08-24 DIAGNOSIS — Z510 Encounter for antineoplastic radiation therapy: Secondary | ICD-10-CM | POA: Diagnosis not present

## 2014-08-24 MED ORDER — HYDROCODONE-ACETAMINOPHEN 10-325 MG PO TABS: 1.0000 | ORAL_TABLET | Freq: Four times a day (QID) | ORAL | Status: DC | PRN

## 2014-08-25 ENCOUNTER — Ambulatory Visit
Admission: RE | Admit: 2014-08-25 | Discharge: 2014-08-25 | Disposition: A | Discharge status: Home / Self Care | Payer: 59 | Source: Ambulatory Visit | Attending: Radiation Oncology | Admitting: Radiation Oncology

## 2014-08-25 ENCOUNTER — Ambulatory Visit: Payer: 59

## 2014-08-25 VITALS — BP 134/78 | HR 91 | Temp 98.2°F | Resp 18

## 2014-08-25 DIAGNOSIS — Z510 Encounter for antineoplastic radiation therapy: Secondary | ICD-10-CM | POA: Diagnosis not present

## 2014-08-25 DIAGNOSIS — C020 Malignant neoplasm of dorsal surface of tongue: Secondary | ICD-10-CM

## 2014-08-25 MED ORDER — SODIUM CHLORIDE 0.9 % IV SOLN
Freq: Once | INTRAVENOUS | Status: AC
  Administered 2014-08-25: 15:00:00 via INTRAVENOUS

## 2014-08-25 MED ORDER — BIAFINE EX EMUL
CUTANEOUS | Status: DC | PRN
  Administered 2014-08-25: 13:00:00 via TOPICAL

## 2014-08-25 NOTE — Patient Instructions (Signed)
Dehydration, Adult Dehydration is when you lose more fluids from the body than you take in. Vital organs like the kidneys, brain, and heart cannot function without a proper amount of fluids and salt. Any loss of fluids from the body can cause dehydration.  CAUSES   Vomiting.  Diarrhea.  Excessive sweating.  Excessive urine output.  Fever. SYMPTOMS  Mild dehydration  Thirst.  Dry lips.  Slightly dry mouth. Moderate dehydration  Very dry mouth.  Sunken eyes.  Skin does not bounce back quickly when lightly pinched and released.  Dark urine and decreased urine production.  Decreased tear production.  Headache. Severe dehydration  Very dry mouth.  Extreme thirst.  Rapid, weak pulse (more than 100 beats per minute at rest).  Cold hands and feet.  Not able to sweat in spite of heat and temperature.  Rapid breathing.  Blue lips.  Confusion and lethargy.  Difficulty being awakened.  Minimal urine production.  No tears. DIAGNOSIS  Your caregiver will diagnose dehydration based on your symptoms and your exam. Blood and urine tests will help confirm the diagnosis. The diagnostic evaluation should also identify the cause of dehydration. TREATMENT  Treatment of mild or moderate dehydration can often be done at home by increasing the amount of fluids that you drink. It is best to drink small amounts of fluid more often. Drinking too much at one time can make vomiting worse. Refer to the home care instructions below. Severe dehydration needs to be treated at the hospital where you will probably be given intravenous (IV) fluids that contain water and electrolytes. HOME CARE INSTRUCTIONS   Ask your caregiver about specific rehydration instructions.  Drink enough fluids to keep your urine clear or pale yellow.  Drink small amounts frequently if you have nausea and vomiting.  Eat as you normally do.  Avoid:  Foods or drinks high in sugar.  Carbonated  drinks.  Juice.  Extremely hot or cold fluids.  Drinks with caffeine.  Fatty, greasy foods.  Alcohol.  Tobacco.  Overeating.  Gelatin desserts.  Wash your hands well to avoid spreading bacteria and viruses.  Only take over-the-counter or prescription medicines for pain, discomfort, or fever as directed by your caregiver.  Ask your caregiver if you should continue all prescribed and over-the-counter medicines.  Keep all follow-up appointments with your caregiver. SEEK MEDICAL CARE IF:  You have abdominal pain and it increases or stays in one area (localizes).  You have a rash, stiff neck, or severe headache.  You are irritable, sleepy, or difficult to awaken.  You are weak, dizzy, or extremely thirsty. SEEK IMMEDIATE MEDICAL CARE IF:   You are unable to keep fluids down or you get worse despite treatment.  You have frequent episodes of vomiting or diarrhea.  You have blood or green matter (bile) in your vomit.  You have blood in your stool or your stool looks black and tarry.  You have not urinated in 6 to 8 hours, or you have only urinated a small amount of very dark urine.  You have a fever.  You faint. MAKE SURE YOU:   Understand these instructions.  Will watch your condition.  Will get help right away if you are not doing well or get worse. Document Released: 06/23/2005 Document Revised: 09/15/2011 Document Reviewed: 02/10/2011 ExitCare Patient Information 2015 [employer]. This information is not intended to replace advice given to you by your health care provider. Make sure you discuss any questions you have with your health care   provider.  

## 2014-08-28 ENCOUNTER — Telehealth: Payer: Self-pay

## 2014-08-28 ENCOUNTER — Ambulatory Visit
Admission: RE | Admit: 2014-08-28 | Discharge: 2014-08-28 | Disposition: A | Discharge status: Home / Self Care | Payer: 59 | Source: Ambulatory Visit | Attending: Radiation Oncology | Admitting: Radiation Oncology

## 2014-08-28 ENCOUNTER — Encounter: Payer: Self-pay | Admitting: Radiation Oncology

## 2014-08-28 ENCOUNTER — Encounter: Payer: Self-pay | Admitting: *Deleted

## 2014-08-28 ENCOUNTER — Ambulatory Visit: Payer: 59

## 2014-08-28 ENCOUNTER — Telehealth: Payer: Self-pay | Admitting: *Deleted

## 2014-08-28 DIAGNOSIS — R52 Pain, unspecified: Secondary | ICD-10-CM | POA: Diagnosis not present

## 2014-08-28 DIAGNOSIS — L598 Other specified disorders of the skin and subcutaneous tissue related to radiation: Secondary | ICD-10-CM | POA: Diagnosis not present

## 2014-08-28 DIAGNOSIS — C020 Malignant neoplasm of dorsal surface of tongue: Secondary | ICD-10-CM | POA: Diagnosis not present

## 2014-08-28 DIAGNOSIS — Z7952 Long term (current) use of systemic steroids: Secondary | ICD-10-CM | POA: Diagnosis not present

## 2014-08-28 DIAGNOSIS — Z79891 Long term (current) use of opiate analgesic: Secondary | ICD-10-CM | POA: Diagnosis not present

## 2014-08-28 DIAGNOSIS — K1233 Oral mucositis (ulcerative) due to radiation: Secondary | ICD-10-CM | POA: Diagnosis not present

## 2014-08-28 DIAGNOSIS — Z510 Encounter for antineoplastic radiation therapy: Secondary | ICD-10-CM | POA: Diagnosis present

## 2014-08-28 DIAGNOSIS — Z79899 Other long term (current) drug therapy: Secondary | ICD-10-CM | POA: Diagnosis not present

## 2014-08-28 MED ORDER — BIAFINE EX EMUL
CUTANEOUS | Status: DC | PRN
  Administered 2014-08-28: 15:00:00 via TOPICAL

## 2014-08-28 MED ORDER — OXYCODONE HCL ER 10 MG PO T12A: 10.0000 mg | EXTENDED_RELEASE_TABLET | Freq: Two times a day (BID) | ORAL | Status: DC

## 2014-08-28 NOTE — Telephone Encounter (Signed)
Left message for patient to return my call.Question whether to set up for iv fluids today as he completes treatment today.

## 2014-08-28 NOTE — Telephone Encounter (Signed)
Per Val in radiation I have scheduled appt for Friday. No available for Tuesday.

## 2014-08-28 NOTE — Progress Notes (Signed)
Met with pt and his sister during final RT to offer support and to celebrate end of radiation treatment.    I provided patient with a Certificate of Recognition for his wife.    I explained that my role as navigator will continue for several more months and that I will be calling and/or joining him during follow-up visits. He understands he can contact me with needs/concerns.  Gayleen Orem, RN, BSN, [place] at [place] [phone]

## 2014-08-28 NOTE — Progress Notes (Signed)
Patient completes treatment today.given another tube of biafine to apply 2 to 3 times daily.Scheduled for intravenous fluids at [facility] on 08/29/14 at 11:00 am and at the [facility] Friday 09/01/14 at 1:00 pm.Follow up in one week with Dr.Wentworth.Knows to contact me this Friday if needed while he is in infusion room.

## 2014-08-28 NOTE — Progress Notes (Signed)
Department of Radiation Oncology  Phone:  [phone] Fax:        [phone]  Weekly Treatment Note    Name: [name] Date: 08/28/2014 MRN: [id number] DOB: 12-19-1960   Current dose: 60 Gy  Current fraction: 30   MEDICATIONS: Current Outpatient Prescriptions  Medication Sig Dispense Refill  . co-enzyme Q-10 30 MG capsule Take 30 mg by mouth daily.    . cycloSPORINE (SANDIMMUNE) 100 MG capsule Take 100 mg by mouth 2 (two) times daily. 125 mg in morning and  100 mg at night    . emollient (BIAFINE) cream Apply topically 2 (two) times daily.    Marland Kitchen guaiFENesin (MUCINEX) 600 MG 12 hr tablet Take by mouth 2 (two) times daily.    Marland Kitchen HYDROcodone-acetaminophen (NORCO) 10-325 MG per tablet Take 1 tablet by mouth every 6 (six) hours as needed. 1 tab every 4-6 hours po PRN 90 tablet 0  . lidocaine (XYLOCAINE) 5 % ointment Apply 1 application topically 3 (three) times daily as needed. 35 g 1  . nystatin (MYCOSTATIN) 100000 UNIT/ML suspension Use as directed 5 mLs (500,000 Units total) in the mouth or throat 4 (four) times daily. 473 mL 1  . prednisoLONE 5 MG TABS tablet Take 5 mg by mouth daily.    . sodium fluoride (FLUORISHIELD) 1.1 % GEL dental gel Instill one drop of gel per tooth space of fluoride tray. Place over teeth for 5 minutes. Remove. Spit out excess. Repeat nightly. 120 mL prn  . fexofenadine (ALLEGRA) 60 MG tablet Take 60 mg by mouth 2 (two) times daily.    Marland Kitchen losartan (COZAAR) 50 MG tablet Take 50 mg by mouth daily.    . Omega-3 Fatty Acids (FISH OIL) 1000 MG CAPS Take 1,000 mg by mouth 2 (two) times daily.    . OxyCODONE (OXYCONTIN) 10 mg T12A 12 hr tablet Take 1 tablet (10 mg total) by mouth every 12 (twelve) hours. 60 tablet 0   Current Facility-Administered Medications  Medication Dose Route Frequency Provider Last Rate Last Dose  . topical emolient (BIAFINE) emulsion   Topical PRN Thea Silversmith, MD         ALLERGIES: Diflucan and Vancomycin   LABORATORY  DATA:  Lab Results  Component Value Date   HGB 12.9* 01/07/2011   HCT 38.0* 01/07/2011   Lab Results  Component Value Date   NA 137 01/07/2011   K 4.8 01/07/2011   CL 104 01/07/2011   No results found for: ALT, AST, GGT, ALKPHOS, BILITOT   NARRATIVE: [name] was seen today for weekly treatment management. The chart was checked and the patient's films were reviewed.  Patient completes treatment today.given another tube of biafine to apply 2 to 3 times daily.Scheduled for intravenous fluids at [facility] on 08/29/14 at 11:00 am and at the [facility] Friday 09/01/14 at 1:00 pm.Follow up in one week with Dr.Wentworth.Knows to contact me this Friday if needed while he is in infusion room.  PHYSICAL EXAMINATION:   Significant skin irritation in the neck, greater on the right. No moist desquamation. Significant mucositis within the oral cavity with some thickened secretions.  ASSESSMENT: The patient finished his final treatment today. Expected irritation at the end of treatment but overall he has been able to complete his course as planned.  PLAN: The patient will follow-up with Dr. Pablo Ledger as has been scheduled. He is having increased pain and he states today that his pain is not adequately controlled with his shorter acting pain  medicine. I gave him a long-acting pain medication prescription today at a starting dose twice a day. We discussed using this in conjunction with his shorter acting pain medicine as needed. The patient has been scheduled for IV fluids.

## 2014-08-29 ENCOUNTER — Ambulatory Visit (HOSPITAL_COMMUNITY)
Admission: RE | Admit: 2014-08-29 | Discharge: 2014-08-29 | Disposition: A | Discharge status: Home / Self Care | Payer: 59 | Source: Ambulatory Visit | Attending: Radiation Oncology | Admitting: Radiation Oncology

## 2014-08-29 ENCOUNTER — Other Ambulatory Visit: Payer: Self-pay | Admitting: Radiation Oncology

## 2014-08-29 DIAGNOSIS — C020 Malignant neoplasm of dorsal surface of tongue: Secondary | ICD-10-CM

## 2014-08-29 MED ORDER — SODIUM CHLORIDE 0.9 % IV SOLN
Freq: Once | INTRAVENOUS | Status: AC
  Administered 2014-08-29: 11:00:00 via INTRAVENOUS

## 2014-08-29 NOTE — Procedures (Signed)
[facility]  Procedure Note  [name] WJX:[id number] DOB: 07/24/1960 DOA: (Not on file)   Dr. 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  Family at bedside for discharge   Roberto Scales, [facility]

## 2014-08-31 ENCOUNTER — Ambulatory Visit: Payer: Self-pay

## 2014-09-01 ENCOUNTER — Other Ambulatory Visit: Payer: Self-pay

## 2014-09-01 ENCOUNTER — Ambulatory Visit: Payer: 59

## 2014-09-01 ENCOUNTER — Encounter: Payer: Self-pay | Admitting: Nutrition

## 2014-09-01 VITALS — BP 130/81 | HR 62 | Temp 98.0°F | Resp 16

## 2014-09-01 DIAGNOSIS — C020 Malignant neoplasm of dorsal surface of tongue: Secondary | ICD-10-CM

## 2014-09-01 MED ORDER — SODIUM CHLORIDE 0.9 % IV SOLN
Freq: Once | INTRAVENOUS | Status: AC
  Administered 2014-09-01: 15:00:00 via INTRAVENOUS

## 2014-09-01 NOTE — Progress Notes (Signed)
  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 Daily MVCT was utilized for target localization.   Narrative: The patient tolerated radiation treatment relatively well at first and the experienced significant mucositis and moist desquamation. He lost a total of 20 pounds through his treatment despite aggressive nutritional support. He also received IVF twice a week for the last 4 weeks of treatment for hydration.   Plan: The patient has completed radiation treatment. The patient will return to radiation oncology clinic for routine followup/skin check in one week. I advised him to call or return sooner if they have any questions or concerns related to their recovery or treatment.  ------------------------------------------------  Thea Silversmith, MD

## 2014-09-01 NOTE — Patient Instructions (Signed)
Dehydration, Adult Dehydration is when you lose more fluids from the body than you take in. Vital organs like the kidneys, brain, and heart cannot function without a proper amount of fluids and salt. Any loss of fluids from the body can cause dehydration.  CAUSES   Vomiting.  Diarrhea.  Excessive sweating.  Excessive urine output.  Fever. SYMPTOMS  Mild dehydration  Thirst.  Dry lips.  Slightly dry mouth. Moderate dehydration  Very dry mouth.  Sunken eyes.  Skin does not bounce back quickly when lightly pinched and released.  Dark urine and decreased urine production.  Decreased tear production.  Headache. Severe dehydration  Very dry mouth.  Extreme thirst.  Rapid, weak pulse (more than 100 beats per minute at rest).  Cold hands and feet.  Not able to sweat in spite of heat and temperature.  Rapid breathing.  Blue lips.  Confusion and lethargy.  Difficulty being awakened.  Minimal urine production.  No tears. DIAGNOSIS  Your caregiver will diagnose dehydration based on your symptoms and your exam. Blood and urine tests will help confirm the diagnosis. The diagnostic evaluation should also identify the cause of dehydration. TREATMENT  Treatment of mild or moderate dehydration can often be done at home by increasing the amount of fluids that you drink. It is best to drink small amounts of fluid more often. Drinking too much at one time can make vomiting worse. Refer to the home care instructions below. Severe dehydration needs to be treated at the hospital where you will probably be given intravenous (IV) fluids that contain water and electrolytes. HOME CARE INSTRUCTIONS   Ask your caregiver about specific rehydration instructions.  Drink enough fluids to keep your urine clear or pale yellow.  Drink small amounts frequently if you have nausea and vomiting.  Eat as you normally do.  Avoid:  Foods or drinks high in sugar.  Carbonated  drinks.  Juice.  Extremely hot or cold fluids.  Drinks with caffeine.  Fatty, greasy foods.  Alcohol.  Tobacco.  Overeating.  Gelatin desserts.  Wash your hands well to avoid spreading bacteria and viruses.  Only take over-the-counter or prescription medicines for pain, discomfort, or fever as directed by your caregiver.  Ask your caregiver if you should continue all prescribed and over-the-counter medicines.  Keep all follow-up appointments with your caregiver. SEEK MEDICAL CARE IF:  You have abdominal pain and it increases or stays in one area (localizes).  You have a rash, stiff neck, or severe headache.  You are irritable, sleepy, or difficult to awaken.  You are weak, dizzy, or extremely thirsty. SEEK IMMEDIATE MEDICAL CARE IF:   You are unable to keep fluids down or you get worse despite treatment.  You have frequent episodes of vomiting or diarrhea.  You have blood or green matter (bile) in your vomit.  You have blood in your stool or your stool looks black and tarry.  You have not urinated in 6 to 8 hours, or you have only urinated a small amount of very dark urine.  You have a fever.  You faint. MAKE SURE YOU:   Understand these instructions.  Will watch your condition.  Will get help right away if you are not doing well or get worse. Document Released: 06/23/2005 Document Revised: 09/15/2011 Document Reviewed: 02/10/2011 ExitCare Patient Information 2015 [employer]. This information is not intended to replace advice given to you by your health care provider. Make sure you discuss any questions you have with your health care   provider.  

## 2014-09-05 ENCOUNTER — Ambulatory Visit: Payer: 59

## 2014-09-05 ENCOUNTER — Ambulatory Visit
Admission: RE | Admit: 2014-09-05 | Discharge: 2014-09-05 | Disposition: A | Discharge status: Home / Self Care | Payer: 59 | Source: Ambulatory Visit | Attending: Radiation Oncology | Admitting: Radiation Oncology

## 2014-09-05 ENCOUNTER — Other Ambulatory Visit: Payer: Self-pay | Admitting: Radiation Oncology

## 2014-09-05 ENCOUNTER — Telehealth: Payer: Self-pay | Admitting: *Deleted

## 2014-09-05 ENCOUNTER — Encounter: Payer: Self-pay | Admitting: Radiation Oncology

## 2014-09-05 ENCOUNTER — Other Ambulatory Visit: Payer: Self-pay | Admitting: *Deleted

## 2014-09-05 VITALS — BP 126/72 | HR 94 | Temp 98.6°F

## 2014-09-05 VITALS — BP 103/66 | HR 107 | Temp 98.3°F | Resp 20 | Ht 72.0 in | Wt 139.0 lb

## 2014-09-05 DIAGNOSIS — Z79891 Long term (current) use of opiate analgesic: Secondary | ICD-10-CM | POA: Insufficient documentation

## 2014-09-05 DIAGNOSIS — C020 Malignant neoplasm of dorsal surface of tongue: Secondary | ICD-10-CM

## 2014-09-05 DIAGNOSIS — C01 Malignant neoplasm of base of tongue: Secondary | ICD-10-CM

## 2014-09-05 MED ORDER — SODIUM CHLORIDE 0.9 % IV SOLN
Freq: Once | INTRAVENOUS | Status: AC
  Administered 2014-09-05: 15:00:00 via INTRAVENOUS

## 2014-09-05 MED ORDER — BIAFINE EX EMUL
CUTANEOUS | Status: DC | PRN
  Administered 2014-09-05: 14:00:00 via TOPICAL

## 2014-09-05 NOTE — Patient Instructions (Signed)

## 2014-09-05 NOTE — Telephone Encounter (Signed)
error 

## 2014-09-05 NOTE — Progress Notes (Addendum)
Orthostatic vitals taken, for follow up s/p rd txs 07/17/14-08/28/14 Standing b/p=132/80,P=87,RR=20,T=98.3 Standing b/p=103/6,P=107,RR=20,  Neck healing, using biafine cream daily C/o pain in mouth, has ulcers on side of left tongue, drinks 2 boost cans daily, 1 protein shake, 2 glasses milk daily, unable to chew foods, has IVF's today 230pm 2:17 PM

## 2014-09-05 NOTE — Progress Notes (Signed)
Weekly Management Note Current Dose:60 Gy  Projected Dose:60 Gy   Narrative:  Patient returns 1 week after finishing 60 Gy to neck. Oxycodone is not helping with pain so he is only drinking 2 boosts and 2 glasses of milk with one protein shake a day. Hydrocodone does work but not strong enough. Using viscous lidocaine. Skin and saliva are improving. Has been getting fluids Tuesdays and Fridays. BM are hard but regular. Taking Colace. Sister accompanies him.   Physical Findings:  Skin is much improved and almost healed. Mucocitis over posterior pharynx, tongue and mandible. Alert and oriented.   Vitals:  Filed Vitals:   09/05/14 1415  BP: 103/66  Pulse: 107  Temp:   Resp: 20   Weight:  Wt Readings from Last 3 Encounters:  09/05/14 139 lb (63.05 kg)  08/28/14 144 lb (65.318 kg)  08/22/14 143 lb 11.2 oz (65.182 kg)    Labs: CR 1.14, BUN 38, Na 134, CL 95, K 4.8. From 2/23. No recent labs.   Impression:  The patient is tolerating radiation.  Plan:  Continue treatment as planned. Schedule IVF Tuesday and Friday for next week. OK to double up on hycet but only take 2 times per day. Encourage po. OK to take fiber tablets. Labs next week.Follow up in 2 weeks.

## 2014-09-08 ENCOUNTER — Ambulatory Visit: Payer: 59

## 2014-09-08 ENCOUNTER — Other Ambulatory Visit: Payer: Self-pay

## 2014-09-08 VITALS — BP 115/62 | HR 66 | Temp 98.3°F | Resp 18

## 2014-09-08 DIAGNOSIS — C029 Malignant neoplasm of tongue, unspecified: Secondary | ICD-10-CM

## 2014-09-08 MED ORDER — SODIUM CHLORIDE 0.9 % IV SOLN
1000.0000 mL | Freq: Once | INTRAVENOUS | Status: AC
  Administered 2014-09-08: 14:00:00 via INTRAVENOUS

## 2014-09-08 NOTE — Patient Instructions (Signed)

## 2014-09-11 ENCOUNTER — Other Ambulatory Visit: Payer: Self-pay

## 2014-09-11 ENCOUNTER — Ambulatory Visit: Payer: 59

## 2014-09-11 ENCOUNTER — Encounter: Payer: Self-pay | Admitting: *Deleted

## 2014-09-11 VITALS — BP 127/77 | HR 67 | Temp 98.1°F | Resp 20

## 2014-09-11 DIAGNOSIS — C029 Malignant neoplasm of tongue, unspecified: Secondary | ICD-10-CM

## 2014-09-11 MED ORDER — SODIUM CHLORIDE 0.9 % IV SOLN
Freq: Once | INTRAVENOUS | Status: AC
  Administered 2014-09-11: 15:00:00 via INTRAVENOUS

## 2014-09-11 NOTE — Patient Instructions (Signed)
Dehydration, Adult Dehydration is when you lose more fluids from the body than you take in. Vital organs like the kidneys, brain, and heart cannot function without a proper amount of fluids and salt. Any loss of fluids from the body can cause dehydration.  CAUSES   Vomiting.  Diarrhea.  Excessive sweating.  Excessive urine output.  Fever. SYMPTOMS  Mild dehydration  Thirst.  Dry lips.  Slightly dry mouth. Moderate dehydration  Very dry mouth.  Sunken eyes.  Skin does not bounce back quickly when lightly pinched and released.  Dark urine and decreased urine production.  Decreased tear production.  Headache. Severe dehydration  Very dry mouth.  Extreme thirst.  Rapid, weak pulse (more than 100 beats per minute at rest).  Cold hands and feet.  Not able to sweat in spite of heat and temperature.  Rapid breathing.  Blue lips.  Confusion and lethargy.  Difficulty being awakened.  Minimal urine production.  No tears. DIAGNOSIS  Your caregiver will diagnose dehydration based on your symptoms and your exam. Blood and urine tests will help confirm the diagnosis. The diagnostic evaluation should also identify the cause of dehydration. TREATMENT  Treatment of mild or moderate dehydration can often be done at home by increasing the amount of fluids that you drink. It is best to drink small amounts of fluid more often. Drinking too much at one time can make vomiting worse. Refer to the home care instructions below. Severe dehydration needs to be treated at the hospital where you will probably be given intravenous (IV) fluids that contain water and electrolytes. HOME CARE INSTRUCTIONS   Ask your caregiver about specific rehydration instructions.  Drink enough fluids to keep your urine clear or pale yellow.  Drink small amounts frequently if you have nausea and vomiting.  Eat as you normally do.  Avoid:  Foods or drinks high in sugar.  Carbonated  drinks.  Juice.  Extremely hot or cold fluids.  Drinks with caffeine.  Fatty, greasy foods.  Alcohol.  Tobacco.  Overeating.  Gelatin desserts.  Wash your hands well to avoid spreading bacteria and viruses.  Only take over-the-counter or prescription medicines for pain, discomfort, or fever as directed by your caregiver.  Ask your caregiver if you should continue all prescribed and over-the-counter medicines.  Keep all follow-up appointments with your caregiver. SEEK MEDICAL CARE IF:  You have abdominal pain and it increases or stays in one area (localizes).  You have a rash, stiff neck, or severe headache.  You are irritable, sleepy, or difficult to awaken.  You are weak, dizzy, or extremely thirsty. SEEK IMMEDIATE MEDICAL CARE IF:   You are unable to keep fluids down or you get worse despite treatment.  You have frequent episodes of vomiting or diarrhea.  You have blood or green matter (bile) in your vomit.  You have blood in your stool or your stool looks black and tarry.  You have not urinated in 6 to 8 hours, or you have only urinated a small amount of very dark urine.  You have a fever.  You faint. MAKE SURE YOU:   Understand these instructions.  Will watch your condition.  Will get help right away if you are not doing well or get worse. Document Released: 06/23/2005 Document Revised: 09/15/2011 Document Reviewed: 02/10/2011 ExitCare Patient Information 2015 [employer]. This information is not intended to replace advice given to you by your health care provider. Make sure you discuss any questions you have with your health care   provider.  

## 2014-09-12 NOTE — Progress Notes (Signed)
Briefly met with patient in [facility] lobby. 1. He reported he is doing well since final RT.  Mouth soreness is resolving somewhat.  He is eating/drinking marginally better.  Denies significant pain. 2. He understands he can contact me with needs/concerns.  Gayleen Orem, RN, BSN, [place] at [place] [phone]

## 2014-09-13 ENCOUNTER — Telehealth: Payer: Self-pay

## 2014-09-13 NOTE — Telephone Encounter (Signed)
Patient will have labs on tomorrow at [facility] and have faxed to our office so we may order intravenous fluids for 09/15/14.

## 2014-09-15 ENCOUNTER — Other Ambulatory Visit: Payer: Self-pay

## 2014-09-15 ENCOUNTER — Ambulatory Visit: Payer: 59 | Admitting: Nutrition

## 2014-09-15 ENCOUNTER — Ambulatory Visit: Payer: 59

## 2014-09-15 ENCOUNTER — Telehealth: Payer: Self-pay | Admitting: *Deleted

## 2014-09-15 VITALS — BP 123/73 | HR 65 | Temp 97.7°F | Resp 16

## 2014-09-15 DIAGNOSIS — C029 Malignant neoplasm of tongue, unspecified: Secondary | ICD-10-CM

## 2014-09-15 MED ORDER — SODIUM CHLORIDE 0.9 % IV SOLN
Freq: Once | INTRAVENOUS | Status: AC
  Administered 2014-09-15: 16:00:00 via INTRAVENOUS

## 2014-09-15 NOTE — Patient Instructions (Signed)
Dehydration, Adult Dehydration is when you lose more fluids from the body than you take in. Vital organs like the kidneys, brain, and heart cannot function without a proper amount of fluids and salt. Any loss of fluids from the body can cause dehydration.  CAUSES   Vomiting.  Diarrhea.  Excessive sweating.  Excessive urine output.  Fever. SYMPTOMS  Mild dehydration  Thirst.  Dry lips.  Slightly dry mouth. Moderate dehydration  Very dry mouth.  Sunken eyes.  Skin does not bounce back quickly when lightly pinched and released.  Dark urine and decreased urine production.  Decreased tear production.  Headache. Severe dehydration  Very dry mouth.  Extreme thirst.  Rapid, weak pulse (more than 100 beats per minute at rest).  Cold hands and feet.  Not able to sweat in spite of heat and temperature.  Rapid breathing.  Blue lips.  Confusion and lethargy.  Difficulty being awakened.  Minimal urine production.  No tears. DIAGNOSIS  Your caregiver will diagnose dehydration based on your symptoms and your exam. Blood and urine tests will help confirm the diagnosis. The diagnostic evaluation should also identify the cause of dehydration. TREATMENT  Treatment of mild or moderate dehydration can often be done at home by increasing the amount of fluids that you drink. It is best to drink small amounts of fluid more often. Drinking too much at one time can make vomiting worse. Refer to the home care instructions below. Severe dehydration needs to be treated at the hospital where you will probably be given intravenous (IV) fluids that contain water and electrolytes. HOME CARE INSTRUCTIONS   Ask your caregiver about specific rehydration instructions.  Drink enough fluids to keep your urine clear or pale yellow.  Drink small amounts frequently if you have nausea and vomiting.  Eat as you normally do.  Avoid:  Foods or drinks high in sugar.  Carbonated  drinks.  Juice.  Extremely hot or cold fluids.  Drinks with caffeine.  Fatty, greasy foods.  Alcohol.  Tobacco.  Overeating.  Gelatin desserts.  Wash your hands well to avoid spreading bacteria and viruses.  Only take over-the-counter or prescription medicines for pain, discomfort, or fever as directed by your caregiver.  Ask your caregiver if you should continue all prescribed and over-the-counter medicines.  Keep all follow-up appointments with your caregiver. SEEK MEDICAL CARE IF:  You have abdominal pain and it increases or stays in one area (localizes).  You have a rash, stiff neck, or severe headache.  You are irritable, sleepy, or difficult to awaken.  You are weak, dizzy, or extremely thirsty. SEEK IMMEDIATE MEDICAL CARE IF:   You are unable to keep fluids down or you get worse despite treatment.  You have frequent episodes of vomiting or diarrhea.  You have blood or green matter (bile) in your vomit.  You have blood in your stool or your stool looks black and tarry.  You have not urinated in 6 to 8 hours, or you have only urinated a small amount of very dark urine.  You have a fever.  You faint. MAKE SURE YOU:   Understand these instructions.  Will watch your condition.  Will get help right away if you are not doing well or get worse. Document Released: 06/23/2005 Document Revised: 09/15/2011 Document Reviewed: 02/10/2011 ExitCare Patient Information 2015 [employer]. This information is not intended to replace advice given to you by your health care provider. Make sure you discuss any questions you have with your health care   provider.  

## 2014-09-15 NOTE — Progress Notes (Signed)
Nutrition follow-up completed with patient in the infusion room.   Weight documented as 139 pounds on March 1, down from 143.7 pounds February 16. Patient has really struggled with oral intake over the last few weeks. Patient reports that oral intake has improved this week.   He is consuming 4 boost plus with meals and soup.  In addition, he is drinking 24 ounces of whole milk a day and consuming Protostat twice a day. Patient reports an improvement in soreness of mouth. He is able to swallow and eat quicker than before.  Educated patient to continue strategies for increased oral intake to promote weight gain.  Encouraged patient to contact me for questions or concerns.  **Disclaimer: This note was dictated with voice recognition software. Similar sounding words can inadvertently be transcribed and this note may contain transcription errors which may not have been corrected upon publication of note.**

## 2014-09-15 NOTE — Progress Notes (Addendum)
Received labs (renal function) results from kidney center.bun elevated at 32, creatinine normal 0.93 , potassium 4.7. Orders placed for infusion of normal saline for today as discussed with Dr.Wentworth.Spoke with patient on 09/11/14 and he is doing better, more cheery and speech clearer not muffled.Follow up with doctor  on September 22, 2014.[place] the country on [place].

## 2014-09-15 NOTE — Telephone Encounter (Signed)
Returned patient's VM inquiry re: necessity of IVF scheduled for this afternoon.  Per my conversation with Val RN, NS IVF needed based on results of yesterday's labwork.  I notified patient, he verbalized understanding.  Gayleen Orem, RN, BSN, [place] at [place] [phone]

## 2014-09-21 ENCOUNTER — Ambulatory Visit: Payer: 59 | Admitting: Radiation Oncology

## 2014-09-22 ENCOUNTER — Ambulatory Visit
Admission: RE | Admit: 2014-09-22 | Discharge: 2014-09-22 | Disposition: A | Discharge status: Home / Self Care | Payer: 59 | Source: Ambulatory Visit | Attending: Radiation Oncology | Admitting: Radiation Oncology

## 2014-09-22 VITALS — BP 124/79 | HR 64 | Temp 98.0°F | Resp 16 | Wt 141.6 lb

## 2014-09-22 DIAGNOSIS — Z923 Personal history of irradiation: Secondary | ICD-10-CM | POA: Insufficient documentation

## 2014-09-22 DIAGNOSIS — C020 Malignant neoplasm of dorsal surface of tongue: Secondary | ICD-10-CM

## 2014-09-22 DIAGNOSIS — Z08 Encounter for follow-up examination after completed treatment for malignant neoplasm: Secondary | ICD-10-CM | POA: Insufficient documentation

## 2014-09-22 DIAGNOSIS — Z85810 Personal history of malignant neoplasm of tongue: Secondary | ICD-10-CM | POA: Insufficient documentation

## 2014-09-22 MED ORDER — BIAFINE EX EMUL
CUTANEOUS | Status: DC | PRN
  Administered 2014-09-22: 17:00:00 via TOPICAL

## 2014-09-22 NOTE — Progress Notes (Signed)
Routine follow up completion of radiation to head/neck for tongue cancer treatment completed on 08/28/14.No longer taking opioids for pain.Has mild soreness of tongue and lower inner lip.Weight up 2 lbs.Skin healed without peeling or drainage.Given another tube of biafine as skin still discolored and tight..Affect much improved.Appetite better.

## 2014-09-23 MED ORDER — LIDOCAINE 5 % EX OINT: 1.0000 "application " | TOPICAL_OINTMENT | Freq: Three times a day (TID) | CUTANEOUS | Status: DC | PRN

## 2014-09-23 NOTE — Progress Notes (Signed)
   Department of Radiation Oncology  Phone:  [phone] Fax:        [phone]   Name: [name] MRN: [id number]  DOB: 1961-03-31  Date: 09/22/2014  Follow Up Visit Note  Diagnosis: T2N1 tongue cancer   Staging form: Lip and Oral Cavity, AJCC 7th Edition     Pathologic stage from 06/15/2014: Stage IVA (T2, N2, cM0) - Signed by Thea Silversmith, MD on 06/15/2014  Summary and Interval since last radiation: 1 month from 60 Gy to the bilateral neck and right oral cavity/tongue  Interval History: Magnus presents today for routine followup.  He is feeling much better. He is eating soft foods and drinking well. His mouth is only slightly dry. His skin has healed up well. His energy levels are returning. He still has a few sore spots on the inside of his mouth and is using lidocaine when he bushes his teeth. Other than that, no pain medications.  He is seeing Dr. Conley Canal in 6 weeks. He is followed by nephrology and his PCP. He sees Dr. Enrique Sack next week. He is interested in implants or dentures when appropriate  Physical Exam:  Filed Vitals:   09/22/14 1607  BP: 124/79  Pulse: 64  Temp: 98 F (36.7 C)  Resp: 16  Weight: 141 lb 9.6 oz (64.229 kg)  SpO2: 100%   Wt Readings from Last 3 Encounters:  09/22/14 141 lb 9.6 oz (64.229 kg)  09/05/14 139 lb (63.05 kg)  08/28/14 144 lb (65.318 kg)  Some mild hyperpigmentation over the neck with areas of hypopigmentation. Thrush under tongue. No evidence of recurrent disease. No mucocitis. Alert and oriented.   IMPRESSION: Toretto is a 54 y.o. male s/p adjuvant radiation with resolving acute effects of treatment  PLAN:  He looks great.  He is following closely with Dr. Conley Canal which is appropriate given his immunocompromised state. I am happy to trade visits with Dr. Conley Canal. I will see him in 3 months. We discussed dental procedures and I would wait at least 1 year before doing anything as he remains at high risk for osteoradionecrosis.   He admits that being a vegetarian this is not really a big deal for him but something he would like to investigate at some point. I gave him a refill of his lidocaine.  He was grateful for our care.     Thea Silversmith, MD

## 2014-09-26 ENCOUNTER — Ambulatory Visit (HOSPITAL_COMMUNITY): Payer: Medicaid - Dental | Admitting: Dentistry

## 2014-09-26 ENCOUNTER — Encounter (INDEPENDENT_AMBULATORY_CARE_PROVIDER_SITE_OTHER): Payer: Self-pay

## 2014-09-26 ENCOUNTER — Encounter (HOSPITAL_COMMUNITY): Payer: Self-pay | Admitting: Dentistry

## 2014-09-26 VITALS — BP 126/74 | HR 67 | Temp 97.7°F | Wt 142.0 lb

## 2014-09-26 DIAGNOSIS — R432 Parageusia: Secondary | ICD-10-CM

## 2014-09-26 DIAGNOSIS — Z923 Personal history of irradiation: Secondary | ICD-10-CM

## 2014-09-26 DIAGNOSIS — K117 Disturbances of salivary secretion: Secondary | ICD-10-CM

## 2014-09-26 DIAGNOSIS — Z0189 Encounter for other specified special examinations: Secondary | ICD-10-CM

## 2014-09-26 DIAGNOSIS — R682 Dry mouth, unspecified: Secondary | ICD-10-CM

## 2014-09-26 DIAGNOSIS — K036 Deposits [accretions] on teeth: Secondary | ICD-10-CM

## 2014-09-26 DIAGNOSIS — C020 Malignant neoplasm of dorsal surface of tongue: Secondary | ICD-10-CM

## 2014-09-26 DIAGNOSIS — K08409 Partial loss of teeth, unspecified cause, unspecified class: Secondary | ICD-10-CM

## 2014-09-26 DIAGNOSIS — K1233 Oral mucositis (ulcerative) due to radiation: Secondary | ICD-10-CM

## 2014-09-26 NOTE — Patient Instructions (Addendum)
RECOMMENDATIONS: 1. Brush after meals and at bedtime.  Use fluoride at bedtime. 2. Use trismus exercises as directed. 3. Use Biotene Rinse or salt water/baking soda rinses. 4. Multiple sips of water as needed. 5. Return to Dr. Jetty Duhamel in two months for oral exam and cleaning after radiation therapy.  Patient will need every 3-4 periodontal recall. 6. Follow up with Dr.Owsley / Dr Jetty Duhamel and possibly Prosthodontist for consideration for implants in the future. Dr. Pablo Ledger desired at least 1 year from last date of radiation therapy before implant procedures if indicated. Patient was made aware of the potential risk for osteoradionecrosis and implant failures with implant placement.   Lenn Cal, DDS

## 2014-09-26 NOTE — Progress Notes (Signed)
09/26/2014  Patient Name:   [name] Date of Birth:   09-27-1960 Medical Record Number: [id number]  BP 126/74 mmHg  Pulse 67  Temp(Src) 97.7 F (36.5 C) (Oral)  Wt 142 lb (64.411 kg)   [name] presents for oral examination after radiation therapy. Patient completed radiation treatments from 07/17/2014 through 08/28/2014 with Dr. Pablo Ledger.  REVIEW OF CHIEF COMPLAINTS: DRY MOUTH: Yes HARD TO SWALLOW: No  HURT TO SWALLOW: No TASTE CHANGES: Patient still has minimal taste, but taste is returning. SORES IN MOUTH: Yes TRISMUS: No problems with trismus. WEIGHT: 142 lbs.  HOME OH REGIMEN:  BRUSHING: Twice a day. FLOSSING: Daily. RINSING: Using salt water and baking soda rinses as well as Biotene rinses. FLUORIDE: Patient is starting to use fluoride trays again at bedtime. Previous mucositis prevented him from using the trays until recently. TRISMUS EXERCISES:  Maximum interincisal opening: 40 mm    DENTAL EXAM:  Oral Hygiene:(PLAQUE): Plaque and calculus was noted. Oral hygiene improvement was highly suggested as mucositis resolves. LOCATION OF MUCOSITIS: Right and left lateral tongue area. DESCRIPTION OF SALIVA: Mild xerostomia. ANY EXPOSED BONE: None noted OTHER WATCHED AREAS: Mandibular right lingual vestibule in area of previous surgical resection. Previous extraction sites 1, 15, 16, 18, 19, 20, 29, 30, and 31. DX: Xerostomia, Dysgeusia, Accretions, Mucositis and Loss of teeth  RECOMMENDATIONS: 1. Brush after meals and at bedtime.  Use fluoride at bedtime. 2. Use trismus exercises as directed. 3. Use Biotene Rinse or salt water/baking soda rinses. 4. Multiple sips of water as needed. 5. Return to Dr. Jetty Duhamel in two months for oral exam and cleaning after radiation therapy.  Patient will need every 3-4 periodontal recall. 6. Follow up with Dr.Owsley / Dr Jetty Duhamel and possibly Prosthodontist for consideration for implants in the future. Dr. Pablo Ledger desired at  least 1 year from last date of radiation therapy before implant procedures if indicated. Patient was made aware of the potential risk for osteoradionecrosis and implant failures with implant placement.   Lenn Cal, DDS

## 2014-10-16 ENCOUNTER — Telehealth: Payer: Self-pay | Admitting: *Deleted

## 2014-10-16 NOTE — Telephone Encounter (Signed)
Received call from patient.  He stated is has finished current Nystatin Rx, has some mouth redness but no blisters.  He asked if Dr. Pablo Ledger wants to prescribe additional Nystatin or something else.  I notified Dr. Pablo Ledger and RN Nicholos Johns.  Gayleen Orem, RN, BSN, [place] at [place] [phone]

## 2014-10-18 ENCOUNTER — Telehealth: Payer: Self-pay | Admitting: *Deleted

## 2014-10-18 NOTE — Telephone Encounter (Signed)
Per Dr. Pablo Ledger, informed pt that he does not need to continue with Nystatin or other medication for minimal remaining mucositis.  I encourage him to call if status changes.  He verbalized understanding.  Gayleen Orem, RN, BSN, [place] at [place] [phone]

## 2015-03-03 ENCOUNTER — Encounter (HOSPITAL_COMMUNITY): Payer: Self-pay | Admitting: Emergency Medicine

## 2015-03-03 ENCOUNTER — Emergency Department (INDEPENDENT_AMBULATORY_CARE_PROVIDER_SITE_OTHER)
Admission: EM | Admit: 2015-03-03 | Discharge: 2015-03-03 | Disposition: A | Discharge status: Home / Self Care | Payer: 59 | Source: Home / Self Care | Attending: Family Medicine | Admitting: Family Medicine

## 2015-03-03 DIAGNOSIS — J069 Acute upper respiratory infection, unspecified: Secondary | ICD-10-CM

## 2015-03-03 LAB — POCT RAPID STREP A: Streptococcus, Group A Screen (Direct): NEGATIVE

## 2015-03-03 MED ORDER — IPRATROPIUM BROMIDE 0.06 % NA SOLN: 2.0000 | Freq: Four times a day (QID) | NASAL | Status: DC

## 2015-03-03 NOTE — ED Notes (Signed)
C/o sore throat and runny nose for 2 days

## 2015-03-03 NOTE — ED Provider Notes (Signed)
CSN: [id number]     Arrival date & time 03/03/15  1307 History   First MD Initiated Contact with Patient 03/03/15 1350     Chief Complaint  Patient presents with  . Sore Throat   (Consider location/radiation/quality/duration/timing/severity/associated sxs/prior Treatment) Patient is a 54 y.o. male presenting with pharyngitis. The history is provided by the patient.  Sore Throat This is a new problem. The current episode started 2 days ago. The problem has not changed since onset.Associated symptoms comments: S/p tongue cancer treatment. The symptoms are aggravated by swallowing.    Past Medical History  Diagnosis Date  . HTN (hypertension)   . Renal disease   . Avascular necrosis of hip     bialateral hips  . Tongue cancer     right  . Status post radiation therapy 07/17/14-08/28/14    Tongue   Past Surgical History  Procedure Laterality Date  . 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 Total hip arthroplasty Bilateral   . Hip surgery      bone transplant per pt to both hips  . Perm cath placed and removed    . Partial glossectomy Bilateral 05/23/14    Dr. Conley Canal [facility]  . Neck dissection Bilateral 05/23/14  . Tracheostomy  05/23/14  . Submental flap  05/23/14  . Skin graft  05/23/14    split thickness skin graft neck poss   Family History  Problem Relation Age of Onset  . Thyroid disease Mother    Social History  Substance Use Topics  . Smoking status: Never Smoker   . Smokeless tobacco: Former Systems developer    Types: [place] date: 04/06/2014  . Alcohol Use: 3.6 oz/week    6 Glasses of wine per week    Review of Systems  Constitutional: Negative.   HENT: Positive for congestion, postnasal drip, rhinorrhea and sore throat. Negative for trouble swallowing.   Hematological: Negative for adenopathy.    Allergies  Diflucan and Vancomycin  Home Medications   Prior to Admission medications   Medication Sig Start Date End Date Taking? Authorizing Provider  co-enzyme  Q-10 30 MG capsule Take 30 mg by mouth daily.    Historical Provider, MD  cycloSPORINE (SANDIMMUNE) 100 MG capsule Take 100 mg by mouth 2 (two) times daily. 125 mg in morning and  100 mg at night    Historical Provider, MD  emollient (BIAFINE) cream Apply topically 2 (two) times daily.    Historical Provider, MD  fexofenadine (ALLEGRA) 60 MG tablet Take 60 mg by mouth 2 (two) times daily. 07/29/14   Historical Provider, MD  ipratropium (ATROVENT) 0.06 % nasal spray Place 2 sprays into both nostrils 4 (four) times daily. 03/03/15   Billy Fischer, MD  lidocaine (XYLOCAINE) 5 % ointment Apply 1 application topically 3 (three) times daily as needed. 09/23/14   Thea Silversmith, MD  nystatin (MYCOSTATIN) 100000 UNIT/ML suspension Use as directed 5 mLs (500,000 Units total) in the mouth or throat 4 (four) times daily. 08/01/14   Thea Silversmith, MD  Omega-3 Fatty Acids (FISH OIL) 1000 MG CAPS Take 1,000 mg by mouth 2 (two) times daily.    Historical Provider, MD  prednisoLONE 5 MG TABS tablet Take 5 mg by mouth daily.    Historical Provider, MD  sodium fluoride (FLUORISHIELD) 1.1 % GEL dental gel Instill one drop of gel per tooth space of fluoride tray. Place over teeth for 5 minutes. Remove. Spit out excess. Repeat nightly. 06/22/14   Jori Moll  Carlos Levering, DDS   Meds Ordered and Administered this Visit  Medications - No data to display  BP 143/77 mmHg  Pulse 73  Temp(Src) 98.3 F (36.8 C) (Oral)  Resp 18  SpO2 98% No data found.   Physical Exam  Constitutional: He is oriented to person, place, and time. He appears well-developed and well-nourished.  HENT:  Right Ear: External ear normal.  Left Ear: External ear normal.  Mouth/Throat: Uvula is midline and mucous membranes are normal. Posterior oropharyngeal erythema present. No oropharyngeal exudate or posterior oropharyngeal edema.  Neck: Normal range of motion. Neck supple.  Lymphadenopathy:    He has no cervical adenopathy.  Neurological: He is  alert and oriented to person, place, and time.  Skin: Skin is warm and dry.  Nursing note and vitals reviewed.   ED Course  Procedures (including critical care time)  Labs Review Labs Reviewed  POCT RAPID STREP A    Imaging Review No results found.   Visual Acuity Review  Right Eye Distance:   Left Eye Distance:   Bilateral Distance:    Right Eye Near:   Left Eye Near:    Bilateral Near:         MDM   1. URI (upper respiratory infection)        Billy Fischer, MD 03/03/15 1401

## 2015-03-05 LAB — CULTURE, GROUP A STREP: STREP A CULTURE: NEGATIVE

## 2015-03-05 NOTE — ED Notes (Signed)
Final report of strep labs negative

## 2015-04-25 ENCOUNTER — Other Ambulatory Visit: Payer: Self-pay | Admitting: Nephrology

## 2015-04-25 DIAGNOSIS — R3129 Other microscopic hematuria: Secondary | ICD-10-CM

## 2015-04-30 ENCOUNTER — Ambulatory Visit
Admission: RE | Admit: 2015-04-30 | Discharge: 2015-04-30 | Disposition: A | Discharge status: Home / Self Care | Payer: 59 | Source: Ambulatory Visit | Attending: Nephrology | Admitting: Nephrology

## 2015-04-30 DIAGNOSIS — R3129 Other microscopic hematuria: Secondary | ICD-10-CM

## 2015-04-30 MED ORDER — IOPAMIDOL (ISOVUE-300) INJECTION 61%
100.0000 mL | Freq: Once | INTRAVENOUS | Status: AC | PRN
  Administered 2015-04-30: 100 mL via INTRAVENOUS

## 2015-06-18 ENCOUNTER — Encounter (HOSPITAL_COMMUNITY): Payer: Self-pay | Admitting: *Deleted

## 2015-06-18 ENCOUNTER — Emergency Department (INDEPENDENT_AMBULATORY_CARE_PROVIDER_SITE_OTHER)
Admission: EM | Admit: 2015-06-18 | Discharge: 2015-06-18 | Disposition: A | Discharge status: Home / Self Care | Payer: 59 | Source: Home / Self Care | Attending: Family Medicine | Admitting: Family Medicine

## 2015-06-18 DIAGNOSIS — J069 Acute upper respiratory infection, unspecified: Secondary | ICD-10-CM

## 2015-06-18 MED ORDER — IPRATROPIUM BROMIDE 0.06 % NA SOLN: 2.0000 | Freq: Four times a day (QID) | NASAL | Status: DC

## 2015-06-18 NOTE — ED Provider Notes (Signed)
CSN: [id number]     Arrival date & time 06/18/15  1312 History   First MD Initiated Contact with Patient 06/18/15 1436     Chief Complaint  Patient presents with  . Sore Throat   (Consider location/radiation/quality/duration/timing/severity/associated sxs/prior Treatment) Patient is a 54 y.o. male presenting with pharyngitis. The history is provided by the patient.  Sore Throat This is a new problem. The current episode started 2 days ago. The problem has not changed since onset.The symptoms are aggravated by swallowing.    Past Medical History  Diagnosis Date  . HTN (hypertension)   . Renal disease   . Avascular necrosis of hip     bialateral hips  . Tongue cancer     right  . Status post radiation therapy 07/17/14-08/28/14    Tongue   Past Surgical History  Procedure Laterality Date  . 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 Total hip arthroplasty Bilateral   . Hip surgery      bone transplant per pt to both hips  . Perm cath placed and removed    . Partial glossectomy Bilateral 05/23/14    Dr. Conley Canal [facility]  . Neck dissection Bilateral 05/23/14  . Tracheostomy  05/23/14  . Submental flap  05/23/14  . Skin graft  05/23/14    split thickness skin graft neck poss   Family History  Problem Relation Age of Onset  . Thyroid disease Mother    Social History  Substance Use Topics  . Smoking status: Never Smoker   . Smokeless tobacco: Former Systems developer    Types: [place] date: 04/06/2014  . Alcohol Use: 3.6 oz/week    6 Glasses of wine per week    Review of Systems  Constitutional: Negative.   HENT: Positive for congestion, postnasal drip, rhinorrhea and sore throat.   Respiratory: Negative.   Cardiovascular: Negative.   All other systems reviewed and are negative.   Allergies  Diflucan and Vancomycin  Home Medications   Prior to Admission medications   Medication Sig Start Date End Date Taking? Authorizing Provider  co-enzyme Q-10 30 MG capsule Take 30 mg by  mouth daily.    Historical Provider, MD  cycloSPORINE (SANDIMMUNE) 100 MG capsule Take 100 mg by mouth 2 (two) times daily. 125 mg in morning and  100 mg at night    Historical Provider, MD  emollient (BIAFINE) cream Apply topically 2 (two) times daily.    Historical Provider, MD  fexofenadine (ALLEGRA) 60 MG tablet Take 60 mg by mouth 2 (two) times daily. 07/29/14   Historical Provider, MD  ipratropium (ATROVENT) 0.06 % nasal spray Place 2 sprays into both nostrils 4 (four) times daily. 06/18/15   Billy Fischer, MD  lidocaine (XYLOCAINE) 5 % ointment Apply 1 application topically 3 (three) times daily as needed. 09/23/14   Thea Silversmith, MD  nystatin (MYCOSTATIN) 100000 UNIT/ML suspension Use as directed 5 mLs (500,000 Units total) in the mouth or throat 4 (four) times daily. 08/01/14   Thea Silversmith, MD  Omega-3 Fatty Acids (FISH OIL) 1000 MG CAPS Take 1,000 mg by mouth 2 (two) times daily.    Historical Provider, MD  prednisoLONE 5 MG TABS tablet Take 5 mg by mouth daily.    Historical Provider, MD  sodium fluoride (FLUORISHIELD) 1.1 % GEL dental gel Instill one drop of gel per tooth space of fluoride tray. Place over teeth for 5 minutes. Remove. Spit out excess. Repeat nightly. 06/22/14   Pete Glatter  Kulinski, DDS   Meds Ordered and Administered this Visit  Medications - No data to display  BP 135/78 mmHg  Pulse 98  Temp(Src) 98 F (36.7 C) (Oral)  Resp 16  SpO2 99% No data found.   Physical Exam  Constitutional: He is oriented to person, place, and time. He appears well-developed and well-nourished. No distress.  HENT:  Right Ear: External ear normal.  Left Ear: External ear normal.  Nose: Mucosal edema and rhinorrhea present.  Mouth/Throat: Oropharynx is clear and moist. No oropharyngeal exudate.  Neck: Normal range of motion. Neck supple.  Cardiovascular: Normal heart sounds.   Pulmonary/Chest: Effort normal and breath sounds normal.  Lymphadenopathy:    He has no cervical  adenopathy.  Neurological: He is alert and oriented to person, place, and time.  Skin: Skin is warm and dry.  Nursing note and vitals reviewed.   ED Course  Procedures (including critical care time)  Labs Review Labs Reviewed - No data to display  Imaging Review No results found.   Visual Acuity Review  Right Eye Distance:   Left Eye Distance:   Bilateral Distance:    Right Eye Near:   Left Eye Near:    Bilateral Near:         MDM   1. URI (upper respiratory infection)        Billy Fischer, MD 06/18/15 [phone]

## 2015-06-18 NOTE — Discharge Instructions (Signed)
Drink plenty of fluids as discussed, use medicine as prescribed, and mucinex or delsym for cough. Return or see your doctor if further problems °

## 2015-06-18 NOTE — ED Notes (Signed)
Pt  Reports  Symptoms  Of  sorethroat   With  Pain  When  He   Swallows           Pt  Reports symptoms  X  2  Days-  Pt   Is  A    Kidney    Donor  recipeant

## 2015-07-10 MED FILL — FLUORISHIELD 1.1% GEL: 1.1 % | 30 days supply | Qty: 114 | Fill #0

## 2015-08-29 ENCOUNTER — Encounter: Payer: Self-pay | Admitting: Internal Medicine

## 2015-10-08 MED FILL — FLUORISHIELD 1.1% GEL: 1.1 % | 30 days supply | Qty: 114 | Fill #1

## 2015-12-05 ENCOUNTER — Telehealth: Payer: Self-pay | Admitting: *Deleted

## 2015-12-06 NOTE — Telephone Encounter (Signed)
  Oncology Nurse Navigator Documentation  Navigator Location: [facility] Onc (12/05/15 1051) Navigator Encounter Type: Telephone (12/05/15 1051) Telephone: Incoming Call (12/05/15 1051)                 Interventions: Coordination of Care (12/05/15 1051)       In follow-up to yesterday's call from [name]'s wife, LVMM for him indicating: 1. Radiation treatment records have been sent to Dr. Landry Corporal office. 2. He will be contacted by this office to schedule a follow-up appt with Dr. Pablo Ledger. Encouraged him to call with questions.  Gayleen Orem, RN, BSN, [place] at [place] [phone]                    Time Spent with Patient: 15 (12/05/15 1051)

## 2016-07-26 ENCOUNTER — Other Ambulatory Visit: Payer: Self-pay | Admitting: Nephrology

## 2016-08-01 ENCOUNTER — Encounter: Payer: Self-pay | Admitting: Internal Medicine

## 2017-03-18 ENCOUNTER — Encounter: Payer: Self-pay | Admitting: Podiatry

## 2017-03-18 ENCOUNTER — Ambulatory Visit (INDEPENDENT_AMBULATORY_CARE_PROVIDER_SITE_OTHER): Payer: 59 | Admitting: Podiatry

## 2017-03-18 VITALS — BP 127/76 | HR 68

## 2017-03-18 DIAGNOSIS — S90212A Contusion of left great toe with damage to nail, initial encounter: Secondary | ICD-10-CM | POA: Diagnosis not present

## 2017-03-18 DIAGNOSIS — L603 Nail dystrophy: Secondary | ICD-10-CM | POA: Diagnosis not present

## 2017-03-18 NOTE — Progress Notes (Signed)
   Subjective:    Patient ID: [name], male    DOB: 10/28/1960, 55 y.o.   MRN: [id number]  HPI    Review of Systems  HENT: Positive for tinnitus.   Skin:       Change in nails       Objective:   Physical Exam        Assessment & Plan:

## 2017-03-20 NOTE — Progress Notes (Signed)
   HPI: Patient is a 56 year old male presenting today as a new patient with a complaint of a dark, discolored left great toenail. He states the area became this way about two months ago. He states he plays tennis frequently which may be the cause of the symptoms. He denies any pain. He is here for further evaluation and treatment.  Past Medical History:  Diagnosis Date  . 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 HTN (hypertension)   . Renal disease   . Status post radiation therapy 07/17/14-08/28/14   Tongue  . Tongue cancer [facility])    right     Physical Exam: General: The patient is alert and oriented x3 in no acute distress.  Dermatology: Skin is warm, dry and supple bilateral lower extremities. Negative for open lesions or macerations.Hyperkeratotic dystrophic toenail noted with the discoloration consistent with a subungual hematoma noted.  Vascular: Palpable pedal pulses bilaterally. No edema or erythema noted. Capillary refill within normal limits.  Neurological: Epicritic and protective threshold grossly intact bilaterally.   Musculoskeletal Exam: Range of motion within normal limits to all pedal and ankle joints bilateral. Muscle strength 5/5 in all groups bilateral.    Assessment: - Subungual hematoma left great toe   Plan of Care:  - Patient evaluated. - Mechanical debridement of the toenail of the left great toes performed using a nail nipper without incident or bleeding. - Recommend that the patient wear good supportive shoe gear -  Return to clinic when necessary   Edrick Kins, DPM [facility]  Dr. Edrick Kins, DPM    [address], [place] [address]                Office [phone]  Fax [phone]

## 2017-12-15 ENCOUNTER — Ambulatory Visit (HOSPITAL_COMMUNITY)
Admission: EM | Admit: 2017-12-15 | Discharge: 2017-12-15 | Disposition: A | Discharge status: Home / Self Care | Payer: 59 | Attending: Family Medicine | Admitting: Family Medicine

## 2017-12-15 ENCOUNTER — Encounter (HOSPITAL_COMMUNITY): Payer: Self-pay | Admitting: Emergency Medicine

## 2017-12-15 ENCOUNTER — Other Ambulatory Visit: Payer: Self-pay

## 2017-12-15 DIAGNOSIS — Z940 Kidney transplant status: Secondary | ICD-10-CM

## 2017-12-15 DIAGNOSIS — S29012A Strain of muscle and tendon of back wall of thorax, initial encounter: Secondary | ICD-10-CM | POA: Diagnosis not present

## 2017-12-15 MED ORDER — DICLOFENAC SODIUM 1 % TD GEL: 2.0000 g | Freq: Four times a day (QID) | TRANSDERMAL | 0 refills | Status: DC

## 2017-12-15 MED ORDER — TIZANIDINE HCL 4 MG PO TABS: 4.0000 mg | ORAL_TABLET | Freq: Every day | ORAL | 0 refills | Status: DC

## 2017-12-15 NOTE — ED Provider Notes (Signed)
[place]    CSN: [id number] Arrival date & time: 12/15/17  1703     History   Chief Complaint Chief Complaint  Patient presents with  . Shoulder Pain    HPI [name] is a 57 y.o. male.   HPI  Shoulder pain for several days Right shoulder, thought he slept wrong, no injury or overuse He is unable to use NSAID medicines due to kidney transplant status He got a massage last night that he thinks may have made it more tight  Past Medical History:  Diagnosis Date  . 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 HTN (hypertension)   . Renal disease   . Status post radiation therapy 07/17/14-08/28/14   Tongue  . Tongue cancer [facility])    right    Patient Active Problem List   Diagnosis Date Noted  . 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 HIP SURGERY     bone transplant per pt to both hips  . KIDNEY TRANSPLANT  11/1998  . NECK DISSECTION Bilateral 05/23/14  . PARTIAL GLOSSECTOMY Bilateral 05/23/14   Dr. Conley Canal [facility]  . perm cath placed and removed    . SKIN GRAFT  05/23/14   split thickness skin graft neck poss  . submental flap  05/23/14  . TOTAL HIP ARTHROPLASTY Bilateral   . TRACHEOSTOMY  05/23/14       Home Medications    Prior to Admission medications   Medication Sig Start Date End Date Taking? Authorizing Provider  cycloSPORINE (SANDIMMUNE) 100 MG capsule Take 100 mg by mouth 2 (two) times daily. 125 mg in morning and  100 mg at night    [provider]  cycloSPORINE modified (NEORAL) 25 MG capsule  03/04/17   [provider]  diclofenac sodium (VOLTAREN) 1 % GEL Apply 2 g topically 4 (four) times daily. 12/15/17   Raylene Everts, MD  losartan (COZAAR) 50 MG tablet  01/17/17   [provider]  pilocarpine (SALAGEN) 5 MG tablet TAKE 1 TABLET BY MOUTH 3  TIMES DAILY 03/13/17   [provider]    predniSONE (DELTASONE) 5 MG tablet Take 5 mg by mouth.    [provider]  tiZANidine (ZANAFLEX) 4 MG tablet Take 1 tablet (4 mg total) by mouth at bedtime. Muscle relaxer 12/15/17   Raylene Everts, MD    Family History Family History  Problem Relation Age of Onset  . Thyroid disease Mother     Social History Social History   Tobacco Use  . Smoking status: Never Smoker  . Smokeless tobacco: Former Systems developer    Types: Chew  Substance Use Topics  . Alcohol use: Yes    Alcohol/week: 3.6 oz    Types: 6 Glasses of wine per week  . Drug use: No     Allergies   Diflucan [fluconazole] and Vancomycin   Review of Systems Review of Systems  Constitutional: Negative for chills and fever.  HENT: Negative for ear pain and sore throat.   Eyes: Negative for pain and visual disturbance.  Respiratory: Negative for cough and shortness of breath.   Cardiovascular: Negative for chest pain and palpitations.  Gastrointestinal: Negative for abdominal pain and vomiting.  Genitourinary: Negative for dysuria and hematuria.  Musculoskeletal: Positive for back pain. Negative for arthralgias.       Upper  Skin: Negative for color  change and rash.  Neurological: Negative for seizures and syncope.  All other systems reviewed and are negative.    Physical Exam Triage Vital Signs ED Triage Vitals  Enc Vitals Group     BP 12/15/17 1755 137/85     Pulse Rate 12/15/17 1755 60     Resp 12/15/17 1755 16     Temp 12/15/17 1755 97.8 F (36.6 C)     Temp Source 12/15/17 1755 Oral     SpO2 12/15/17 1755 99 %     Weight --      Height --      Head Circumference --      Peak Flow --      Pain Score 12/15/17 1756 8     Pain Loc --      Pain Edu? --      Excl. in [place]? --    No data found.  Updated Vital Signs BP 137/85 (BP Location: Left Arm)   Pulse 60   Temp 97.8 F (36.6 C) (Oral)   Resp 16   SpO2 99%   Visual Acuity Right Eye Distance:   Left Eye Distance:   Bilateral  Distance:    Right Eye Near:   Left Eye Near:    Bilateral Near:     Physical Exam  Constitutional: He appears well-developed and well-nourished. No distress.  HENT:  Head: Normocephalic and atraumatic.  S/p tongue cancer  Eyes: Pupils are equal, round, and reactive to light. Conjunctivae are normal.  Neck: Normal range of motion.  Cardiovascular: Normal rate.  Pulmonary/Chest: Effort normal. No respiratory distress.  Abdominal: Soft. He exhibits no distension.  Musculoskeletal: Normal range of motion. He exhibits no edema.       Right shoulder: Normal.       Left shoulder: Normal.       Cervical back: Normal.       Back:  Neurological: He is alert.  Skin: Skin is warm and dry.     UC Treatments / Results  Labs (all labs ordered are listed, but only abnormal results are displayed) Labs Reviewed - No data to display  EKG None  Radiology No results found.  Procedures Procedures (including critical care time)  Medications Ordered in UC Medications - No data to display  Initial Impression / Assessment and Plan / UC Course  I have reviewed the triage vital signs and the nursing notes.  Pertinent labs & imaging results that were available during my care of the patient were reviewed by me and considered in my medical decision making (see chart for details).     *discussed rhomboid muscular strain, treatment, and resolution Final Clinical Impressions(s) / UC Diagnoses   Final diagnoses:  Strain of rhomboid muscle, initial encounter     Discharge Instructions     Use ice to area Gentle massage Gentle stretching of the muscles of the neck and shoulder May use a muscle rub May take muscle relaxer at bed Expect resolution after a couple of weeks    ED Prescriptions    Medication Sig Dispense Auth. Provider   tiZANidine (ZANAFLEX) 4 MG tablet Take 1 tablet (4 mg total) by mouth at bedtime. Muscle relaxer 30 tablet Raylene Everts, MD   diclofenac sodium  (VOLTAREN) 1 % GEL Apply 2 g topically 4 (four) times daily. Johnston, MD     Controlled Substance Prescriptions [place] Controlled Substance Registry consulted? Not Applicable   Raylene Everts, MD 12/15/17 Virl Cagey

## 2017-12-15 NOTE — Discharge Instructions (Signed)
Use ice to area Gentle massage Gentle stretching of the muscles of the neck and shoulder May use a muscle rub May take muscle relaxer at bed Expect resolution after a couple of weeks

## 2017-12-15 NOTE — ED Triage Notes (Signed)
The patient presented to the [facility] with a complaint of right shoulder and upper right back x 3 days.

## 2018-01-11 ENCOUNTER — Other Ambulatory Visit: Payer: Self-pay | Admitting: Family Medicine

## 2018-01-16 ENCOUNTER — Ambulatory Visit: Payer: 59 | Admitting: Emergency Medicine

## 2018-01-16 ENCOUNTER — Encounter: Payer: Self-pay | Admitting: Emergency Medicine

## 2018-01-16 VITALS — BP 113/73 | HR 65 | Temp 97.8°F | Resp 17 | Ht 72.5 in | Wt 163.0 lb

## 2018-01-16 DIAGNOSIS — H1132 Conjunctival hemorrhage, left eye: Secondary | ICD-10-CM | POA: Diagnosis not present

## 2018-01-16 NOTE — Progress Notes (Signed)
[name] 57 y.o.   Chief Complaint  Patient presents with  . Eye Injury    HISTORY OF PRESENT ILLNESS: This is a 57 y.o. male complaining of painless left eye redness that started yesterday.  Denies injury.  Denies visual disturbance.  Denies headache, nausea or vomiting, fever or chills.  No other associated symptoms. 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  HPI   Prior to Admission medications   Medication Sig Start Date End Date Taking? Authorizing Provider  cycloSPORINE (SANDIMMUNE) 100 MG capsule Take 100 mg by mouth 2 (two) times daily. 125 mg in morning and  100 mg at night   Yes [provider]  cycloSPORINE modified (NEORAL) 25 MG capsule  03/04/17  Yes [provider]  losartan (COZAAR) 50 MG tablet  01/17/17  Yes [provider]  predniSONE (DELTASONE) 5 MG tablet Take 5 mg by mouth.   Yes [provider]    Allergies  Allergen Reactions  . Diflucan [Fluconazole] Other (See Comments)    Incompatible with cyclosporine  . Vancomycin Rash    Patient Active Problem List   Diagnosis Date Noted  . 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 HTN (hypertension)   . Renal disease   . Status post radiation therapy 07/17/14-08/28/14   Tongue  . Tongue cancer [facility])    right    Past Surgical History:  Procedure Laterality Date  . HIP SURGERY     bone transplant per pt to both hips  . KIDNEY TRANSPLANT  11/1998  . NECK DISSECTION Bilateral 05/23/14  . PARTIAL GLOSSECTOMY Bilateral 05/23/14   Dr. Conley Canal [facility]  . perm cath placed and removed    . SKIN GRAFT  05/23/14   split thickness skin graft neck poss  . submental flap  05/23/14  . TOTAL HIP ARTHROPLASTY Bilateral   . TRACHEOSTOMY  05/23/14    Social History    Socioeconomic History  . Marital status: Married    Spouse name: Not on file  . Number of children: 1  . Years of education: Not on file  . Highest education level: Not on file  Occupational History  . Not on file  Social Needs  . Financial resource strain: Not on file  . Food insecurity:    Worry: Not on file    Inability: Not on file  . Transportation needs:    Medical: Not on file    Non-medical: Not on file  Tobacco Use  . Smoking status: Never Smoker  . Smokeless tobacco: Former Systems developer    Types: Chew  Substance and Sexual Activity  . Alcohol use: Yes    Alcohol/week: 3.6 oz    Types: 6 Glasses of wine per week  . Drug use: No  . Sexual activity: Not on file  Lifestyle  . Physical activity:    Days per week: Not on file    Minutes per session: Not on file  . Stress: Not on file  Relationships  . Social connections:    Talks on phone: Not on file    Gets together: Not on file    Attends religious service: Not on file    Active member of club or organization: Not on file    Attends meetings of clubs or organizations:  Not on file    Relationship status: Not on file  . Intimate partner violence:    Fear of current or ex partner: Not on file    Emotionally abused: Not on file    Physically abused: Not on file    Forced sexual activity: Not on file  Other Topics Concern  . Not on file  Social History Narrative  . Not on file    Family History  Problem Relation Age of Onset  . Thyroid disease Mother      Review of Systems  Constitutional: Negative.  Negative for chills and fever.  HENT: Negative for congestion, nosebleeds and sore throat.   Eyes: Positive for redness. Negative for blurred vision, double vision, photophobia, pain and discharge.  Respiratory: Negative for shortness of breath.   Cardiovascular: Negative for chest pain and palpitations.  Gastrointestinal: Negative for nausea and vomiting.  Skin: Negative.  Negative for rash.  Neurological:  Negative for dizziness and headaches.  All other systems reviewed and are negative.     Vitals:   01/16/18 1341  BP: 113/73  Pulse: 65  Resp: 17  Temp: 97.8 F (36.6 C)  SpO2: 98%    Physical Exam  Constitutional: He is oriented to person, place, and time. He appears well-developed and well-nourished.  HENT:  Head: Normocephalic and atraumatic.  Eyes: Pupils are equal, round, and reactive to light. EOM are normal.  Left sided subconjunctival hemorrhage  Neck: Normal range of motion. Neck supple.  Cardiovascular: Normal rate.  Pulmonary/Chest: Effort normal.  Neurological: He is alert and oriented to person, place, and time.  Skin: Skin is warm and dry. Capillary refill takes less than 2 seconds.  Psychiatric: He has a normal mood and affect. His behavior is normal.  Vitals reviewed.    ASSESSMENT & PLAN: Hayzen was seen today for eye injury.  Diagnoses and all orders for this visit:  Subconjunctival hemorrhage of left eye    Patient Instructions       IF you received an x-ray today, you will receive an invoice from [facility]. Please contact [facility] at [phone] with questions or concerns regarding your invoice.   IF you received labwork today, you will receive an invoice from [facility]. Please contact [facility] at [phone] with questions or concerns regarding your invoice.   Our billing staff will not be able to assist you with questions regarding bills from these companies.  You will be contacted with the lab results as soon as they are available. The fastest way to get your results is to activate your My Chart account. Instructions are located on the last page of this paperwork. If you have not heard from [place] regarding the results in 2 weeks, please contact this office.      Subconjunctival Hemorrhage Subconjunctival hemorrhage is bleeding that happens between the white part of your eye (sclera) and the clear membrane that covers  the outside of your eye (conjunctiva). There are many tiny blood vessels near the surface of your eye. A subconjunctival hemorrhage happens when one or more of these vessels breaks and bleeds, causing a red patch to appear on your eye. This is similar to a bruise. Depending on the amount of bleeding, the red patch may only cover a small area of your eye or it may cover the entire visible part of the sclera. If a lot of blood collects under the conjunctiva, there may also be swelling. Subconjunctival hemorrhages do not affect your vision or cause pain, but  your eye may feel irritated if there is swelling. Subconjunctival hemorrhages usually do not require treatment, and they disappear on their own within two weeks. What are the causes? This condition may be caused by:  Mild trauma, such as rubbing your eye too hard.  Severe trauma or blunt injuries.  Coughing, sneezing, or vomiting.  Straining, such as when lifting a heavy object.  High blood pressure.  Recent eye surgery.  A history of diabetes.  Certain medicines, especially blood thinners (anticoagulants).  Other conditions, such as eye tumors, bleeding disorders, or blood vessel abnormalities.  Subconjunctival hemorrhages can happen without an obvious cause. What are the signs or symptoms? Symptoms of this condition include:  A bright red or dark red patch on the white part of the eye. ? The red area may spread out to cover a larger area of the eye before it goes away. ? The red area may turn brownish-yellow before it goes away.  Swelling.  Mild eye irritation.  How is this diagnosed? This condition is diagnosed with a physical exam. If your subconjunctival hemorrhage was caused by trauma, your health care provider may refer you to an eye specialist (ophthalmologist) or another specialist to check for other injuries. You may have other tests, including:  An eye exam.  A blood pressure check.  Blood tests to check for  bleeding disorders.  If your subconjunctival hemorrhage was caused by trauma, X-rays or a CT scan may be done to check for other injuries. How is this treated? Usually, no treatment is needed. Your health care provider may recommend eye drops or cold compresses to help with discomfort. Follow these instructions at home:  Take over-the-counter and prescription medicines only as directed by your health care provider.  Use eye drops or cold compresses to help with discomfort as directed by your health care provider.  Avoid activities, things, and environments that may irritate or injure your eye.  Keep all follow-up visits as told by your health care provider. This is important. Contact a health care provider if:  You have pain in your eye.  The bleeding does not go away within 3 weeks.  You keep getting new subconjunctival hemorrhages. Get help right away if:  Your vision changes or you have difficulty seeing.  You suddenly develop severe sensitivity to light.  You develop a severe headache, persistent vomiting, confusion, or abnormal tiredness (lethargy).  Your eye seems to bulge or protrude from your eye socket.  You develop unexplained bruises on your body.  You have unexplained bleeding in another area of your body. This information is not intended to replace advice given to you by your health care provider. Make sure you discuss any questions you have with your health care provider. Document Released: 06/23/2005 Document Revised: 02/17/2016 Document Reviewed: 08/30/2014 Elsevier Interactive Patient Education  2018 Elsevier Inc.      Agustina Caroli, MD [facility]

## 2018-01-16 NOTE — Patient Instructions (Addendum)
IF you received an x-ray today, you will receive an invoice from [facility]. Please contact [facility] at [phone] with questions or concerns regarding your invoice.   IF you received labwork today, you will receive an invoice from [facility]. Please contact [facility] at [phone] with questions or concerns regarding your invoice.   Our billing staff will not be able to assist you with questions regarding bills from these companies.  You will be contacted with the lab results as soon as they are available. The fastest way to get your results is to activate your My Chart account. Instructions are located on the last page of this paperwork. If you have not heard from [place] regarding the results in 2 weeks, please contact this office.      Subconjunctival Hemorrhage Subconjunctival hemorrhage is bleeding that happens between the white part of your eye (sclera) and the clear membrane that covers the outside of your eye (conjunctiva). There are many tiny blood vessels near the surface of your eye. A subconjunctival hemorrhage happens when one or more of these vessels breaks and bleeds, causing a red patch to appear on your eye. This is similar to a bruise. Depending on the amount of bleeding, the red patch may only cover a small area of your eye or it may cover the entire visible part of the sclera. If a lot of blood collects under the conjunctiva, there may also be swelling. Subconjunctival hemorrhages do not affect your vision or cause pain, but your eye may feel irritated if there is swelling. Subconjunctival hemorrhages usually do not require treatment, and they disappear on their own within two weeks. What are the causes? This condition may be caused by:  Mild trauma, such as rubbing your eye too hard.  Severe trauma or blunt injuries.  Coughing, sneezing, or vomiting.  Straining, such as when lifting a heavy object.  High blood pressure.  Recent eye  surgery.  A history of diabetes.  Certain medicines, especially blood thinners (anticoagulants).  Other conditions, such as eye tumors, bleeding disorders, or blood vessel abnormalities.  Subconjunctival hemorrhages can happen without an obvious cause. What are the signs or symptoms? Symptoms of this condition include:  A bright red or dark red patch on the white part of the eye. ? The red area may spread out to cover a larger area of the eye before it goes away. ? The red area may turn brownish-yellow before it goes away.  Swelling.  Mild eye irritation.  How is this diagnosed? This condition is diagnosed with a physical exam. If your subconjunctival hemorrhage was caused by trauma, your health care provider may refer you to an eye specialist (ophthalmologist) or another specialist to check for other injuries. You may have other tests, including:  An eye exam.  A blood pressure check.  Blood tests to check for bleeding disorders.  If your subconjunctival hemorrhage was caused by trauma, X-rays or a CT scan may be done to check for other injuries. How is this treated? Usually, no treatment is needed. Your health care provider may recommend eye drops or cold compresses to help with discomfort. Follow these instructions at home:  Take over-the-counter and prescription medicines only as directed by your health care provider.  Use eye drops or cold compresses to help with discomfort as directed by your health care provider.  Avoid activities, things, and environments that may irritate or injure your eye.  Keep all follow-up visits as told by your health care  provider. This is important. Contact a health care provider if:  You have pain in your eye.  The bleeding does not go away within 3 weeks.  You keep getting new subconjunctival hemorrhages. Get help right away if:  Your vision changes or you have difficulty seeing.  You suddenly develop severe sensitivity to  light.  You develop a severe headache, persistent vomiting, confusion, or abnormal tiredness (lethargy).  Your eye seems to bulge or protrude from your eye socket.  You develop unexplained bruises on your body.  You have unexplained bleeding in another area of your body. This information is not intended to replace advice given to you by your health care provider. Make sure you discuss any questions you have with your health care provider. Document Released: 06/23/2005 Document Revised: 02/17/2016 Document Reviewed: 08/30/2014 Elsevier Interactive Patient Education  [employer].

## 2018-03-16 ENCOUNTER — Encounter (HOSPITAL_BASED_OUTPATIENT_CLINIC_OR_DEPARTMENT_OTHER): Payer: Self-pay

## 2018-03-16 ENCOUNTER — Encounter (HOSPITAL_BASED_OUTPATIENT_CLINIC_OR_DEPARTMENT_OTHER): Payer: 59 | Attending: Internal Medicine

## 2018-03-16 DIAGNOSIS — M272 Inflammatory conditions of jaws: Secondary | ICD-10-CM | POA: Diagnosis present

## 2018-03-16 DIAGNOSIS — Z923 Personal history of irradiation: Secondary | ICD-10-CM | POA: Diagnosis not present

## 2018-03-16 DIAGNOSIS — Y842 Radiological procedure and radiotherapy as the cause of abnormal reaction of the patient, or of later complication, without mention of misadventure at the time of the procedure: Secondary | ICD-10-CM | POA: Diagnosis not present

## 2018-03-16 DIAGNOSIS — Z96643 Presence of artificial hip joint, bilateral: Secondary | ICD-10-CM | POA: Diagnosis not present

## 2018-03-16 DIAGNOSIS — Z85810 Personal history of malignant neoplasm of tongue: Secondary | ICD-10-CM | POA: Insufficient documentation

## 2018-03-16 DIAGNOSIS — Z940 Kidney transplant status: Secondary | ICD-10-CM | POA: Diagnosis not present

## 2018-03-16 DIAGNOSIS — Z87891 Personal history of nicotine dependence: Secondary | ICD-10-CM | POA: Insufficient documentation

## 2018-03-24 ENCOUNTER — Other Ambulatory Visit: Payer: Self-pay | Admitting: Physician Assistant

## 2018-03-24 ENCOUNTER — Ambulatory Visit (HOSPITAL_COMMUNITY)
Admission: RE | Admit: 2018-03-24 | Discharge: 2018-03-24 | Disposition: A | Discharge status: Home / Self Care | Payer: 59 | Source: Ambulatory Visit | Attending: Physician Assistant | Admitting: Physician Assistant

## 2018-03-24 DIAGNOSIS — R21 Rash and other nonspecific skin eruption: Secondary | ICD-10-CM | POA: Diagnosis present

## 2018-03-30 DIAGNOSIS — M272 Inflammatory conditions of jaws: Secondary | ICD-10-CM | POA: Diagnosis not present

## 2018-03-31 DIAGNOSIS — M272 Inflammatory conditions of jaws: Secondary | ICD-10-CM | POA: Diagnosis not present

## 2018-04-01 DIAGNOSIS — M272 Inflammatory conditions of jaws: Secondary | ICD-10-CM | POA: Diagnosis not present

## 2018-04-02 DIAGNOSIS — M272 Inflammatory conditions of jaws: Secondary | ICD-10-CM | POA: Diagnosis not present

## 2018-04-05 DIAGNOSIS — M272 Inflammatory conditions of jaws: Secondary | ICD-10-CM | POA: Diagnosis not present

## 2018-04-06 ENCOUNTER — Encounter (HOSPITAL_BASED_OUTPATIENT_CLINIC_OR_DEPARTMENT_OTHER): Payer: 59 | Attending: Internal Medicine

## 2018-04-06 DIAGNOSIS — Z923 Personal history of irradiation: Secondary | ICD-10-CM | POA: Insufficient documentation

## 2018-04-06 DIAGNOSIS — Y842 Radiological procedure and radiotherapy as the cause of abnormal reaction of the patient, or of later complication, without mention of misadventure at the time of the procedure: Secondary | ICD-10-CM | POA: Diagnosis not present

## 2018-04-06 DIAGNOSIS — Z85810 Personal history of malignant neoplasm of tongue: Secondary | ICD-10-CM | POA: Diagnosis not present

## 2018-04-06 DIAGNOSIS — M272 Inflammatory conditions of jaws: Secondary | ICD-10-CM | POA: Diagnosis not present

## 2018-04-07 DIAGNOSIS — M272 Inflammatory conditions of jaws: Secondary | ICD-10-CM | POA: Diagnosis not present

## 2018-04-08 DIAGNOSIS — M272 Inflammatory conditions of jaws: Secondary | ICD-10-CM | POA: Diagnosis not present

## 2018-04-08 LAB — GLUCOSE, CAPILLARY: GLUCOSE-CAPILLARY: 281 mg/dL — AB (ref 70–99)

## 2018-04-12 DIAGNOSIS — M272 Inflammatory conditions of jaws: Secondary | ICD-10-CM | POA: Diagnosis not present

## 2018-04-13 DIAGNOSIS — M272 Inflammatory conditions of jaws: Secondary | ICD-10-CM | POA: Diagnosis not present

## 2018-04-15 DIAGNOSIS — M272 Inflammatory conditions of jaws: Secondary | ICD-10-CM | POA: Diagnosis not present

## 2018-04-16 DIAGNOSIS — M272 Inflammatory conditions of jaws: Secondary | ICD-10-CM | POA: Diagnosis not present

## 2018-04-19 DIAGNOSIS — M272 Inflammatory conditions of jaws: Secondary | ICD-10-CM | POA: Diagnosis not present

## 2018-04-20 DIAGNOSIS — M272 Inflammatory conditions of jaws: Secondary | ICD-10-CM | POA: Diagnosis not present

## 2018-04-21 DIAGNOSIS — M272 Inflammatory conditions of jaws: Secondary | ICD-10-CM | POA: Diagnosis not present

## 2018-04-22 DIAGNOSIS — M272 Inflammatory conditions of jaws: Secondary | ICD-10-CM | POA: Diagnosis not present

## 2018-04-27 DIAGNOSIS — M272 Inflammatory conditions of jaws: Secondary | ICD-10-CM | POA: Diagnosis not present

## 2018-04-29 DIAGNOSIS — M272 Inflammatory conditions of jaws: Secondary | ICD-10-CM | POA: Diagnosis not present

## 2018-04-30 DIAGNOSIS — M272 Inflammatory conditions of jaws: Secondary | ICD-10-CM | POA: Diagnosis not present

## 2018-05-04 DIAGNOSIS — M272 Inflammatory conditions of jaws: Secondary | ICD-10-CM | POA: Diagnosis not present

## 2018-05-05 DIAGNOSIS — M272 Inflammatory conditions of jaws: Secondary | ICD-10-CM | POA: Diagnosis not present

## 2018-05-06 DIAGNOSIS — M272 Inflammatory conditions of jaws: Secondary | ICD-10-CM | POA: Diagnosis not present

## 2018-05-07 ENCOUNTER — Encounter (HOSPITAL_BASED_OUTPATIENT_CLINIC_OR_DEPARTMENT_OTHER): Payer: 59 | Attending: Internal Medicine

## 2018-05-07 DIAGNOSIS — Y842 Radiological procedure and radiotherapy as the cause of abnormal reaction of the patient, or of later complication, without mention of misadventure at the time of the procedure: Secondary | ICD-10-CM | POA: Diagnosis not present

## 2018-05-07 DIAGNOSIS — M272 Inflammatory conditions of jaws: Secondary | ICD-10-CM | POA: Diagnosis not present

## 2018-05-07 DIAGNOSIS — Z85810 Personal history of malignant neoplasm of tongue: Secondary | ICD-10-CM | POA: Insufficient documentation

## 2018-05-10 DIAGNOSIS — M272 Inflammatory conditions of jaws: Secondary | ICD-10-CM | POA: Diagnosis not present

## 2018-05-11 DIAGNOSIS — M272 Inflammatory conditions of jaws: Secondary | ICD-10-CM | POA: Diagnosis not present

## 2018-05-12 DIAGNOSIS — M272 Inflammatory conditions of jaws: Secondary | ICD-10-CM | POA: Diagnosis not present

## 2018-05-13 DIAGNOSIS — M272 Inflammatory conditions of jaws: Secondary | ICD-10-CM | POA: Diagnosis not present

## 2018-05-14 DIAGNOSIS — M272 Inflammatory conditions of jaws: Secondary | ICD-10-CM | POA: Diagnosis not present

## 2018-05-17 DIAGNOSIS — M272 Inflammatory conditions of jaws: Secondary | ICD-10-CM | POA: Diagnosis not present

## 2018-05-18 DIAGNOSIS — M272 Inflammatory conditions of jaws: Secondary | ICD-10-CM | POA: Diagnosis not present

## 2019-02-07 LAB — HM COLONOSCOPY

## 2019-06-07 IMAGING — CR DG CHEST 2V
2 series · 2 of 2 positions shown · non-contrast
Comparison: Chest radiograph 11/26/2004; chest radiograph
12/12/2003.

CLINICAL DATA: Patient with oxygen treatment.

EXAM:
CHEST - 2 VIEW

[w chest pa]
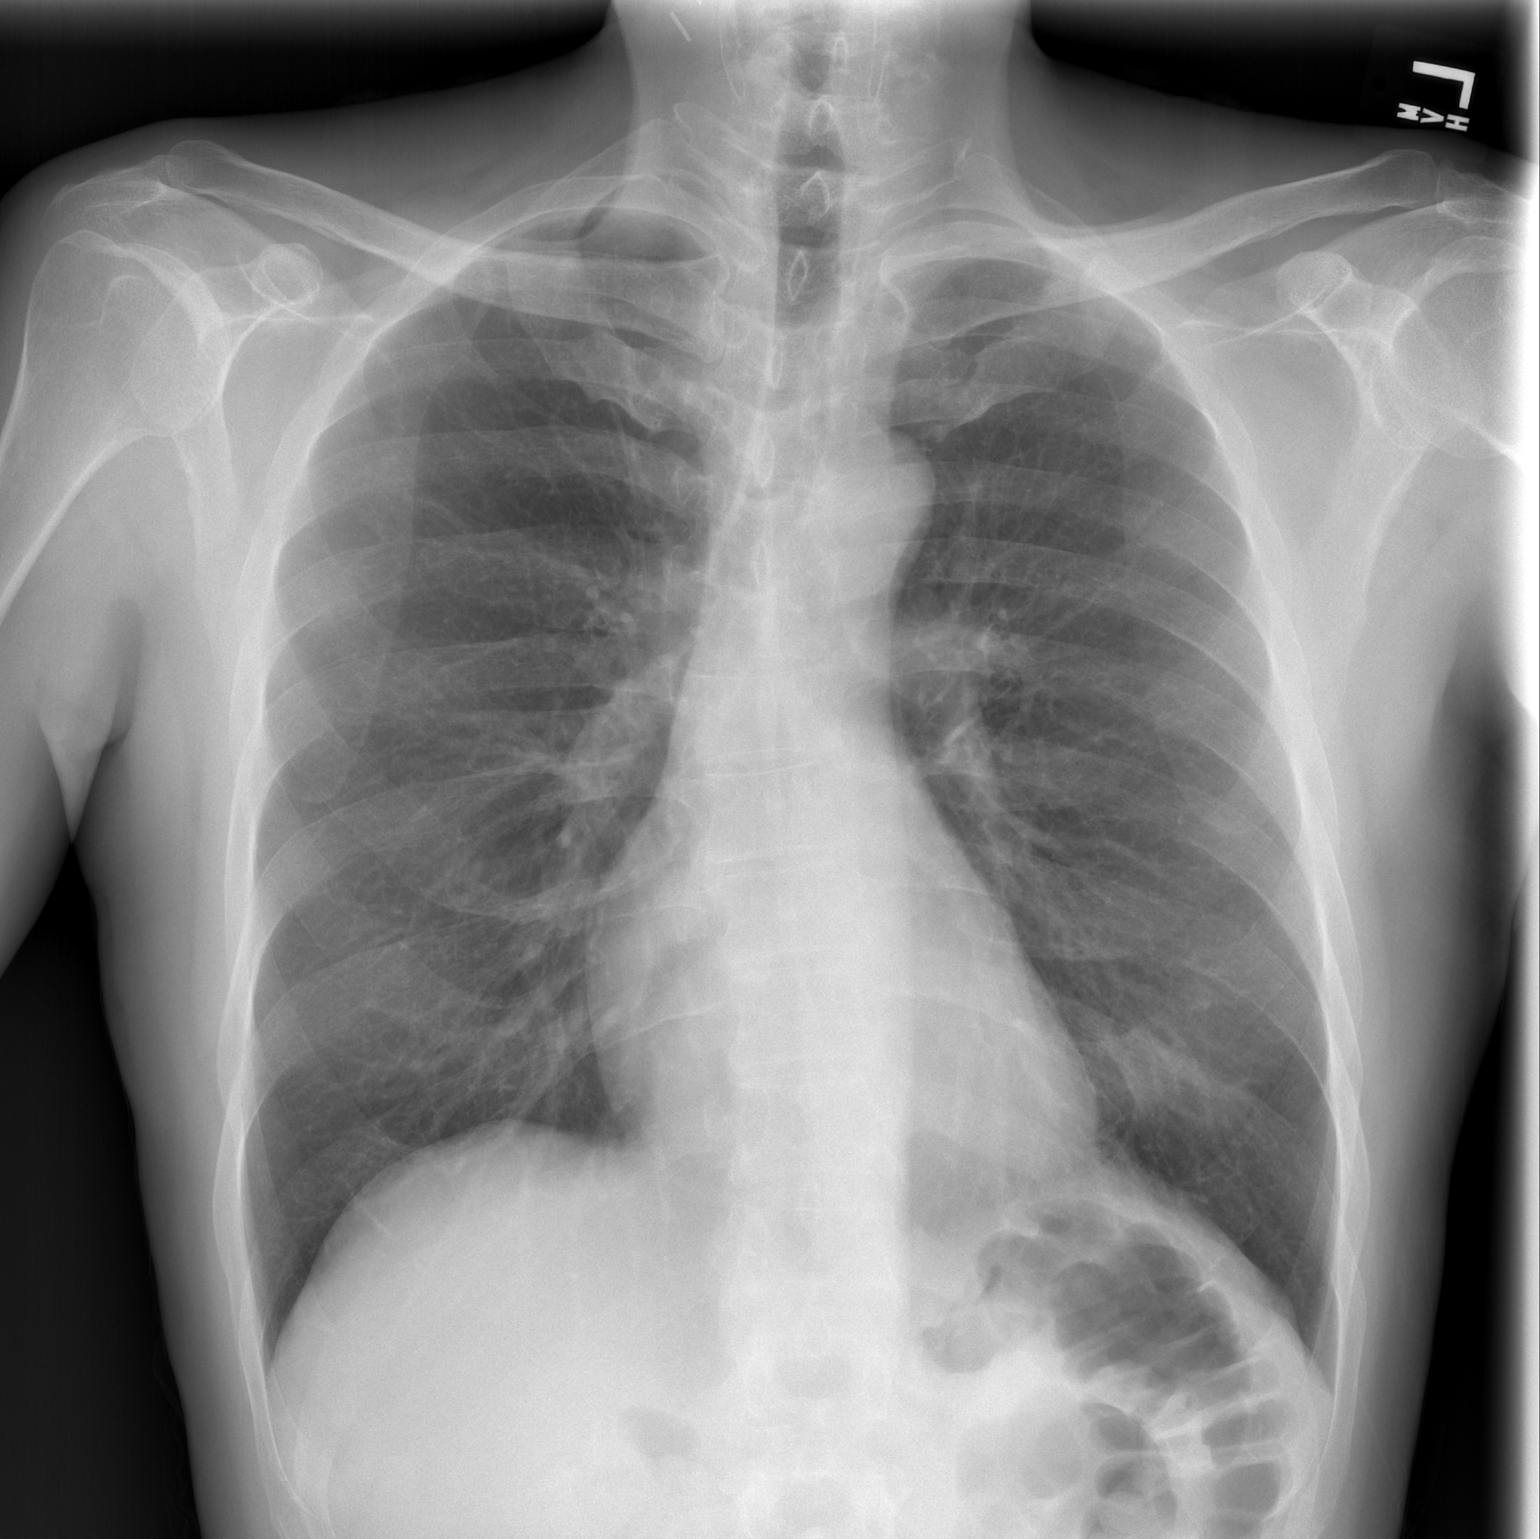

[w chest lat]
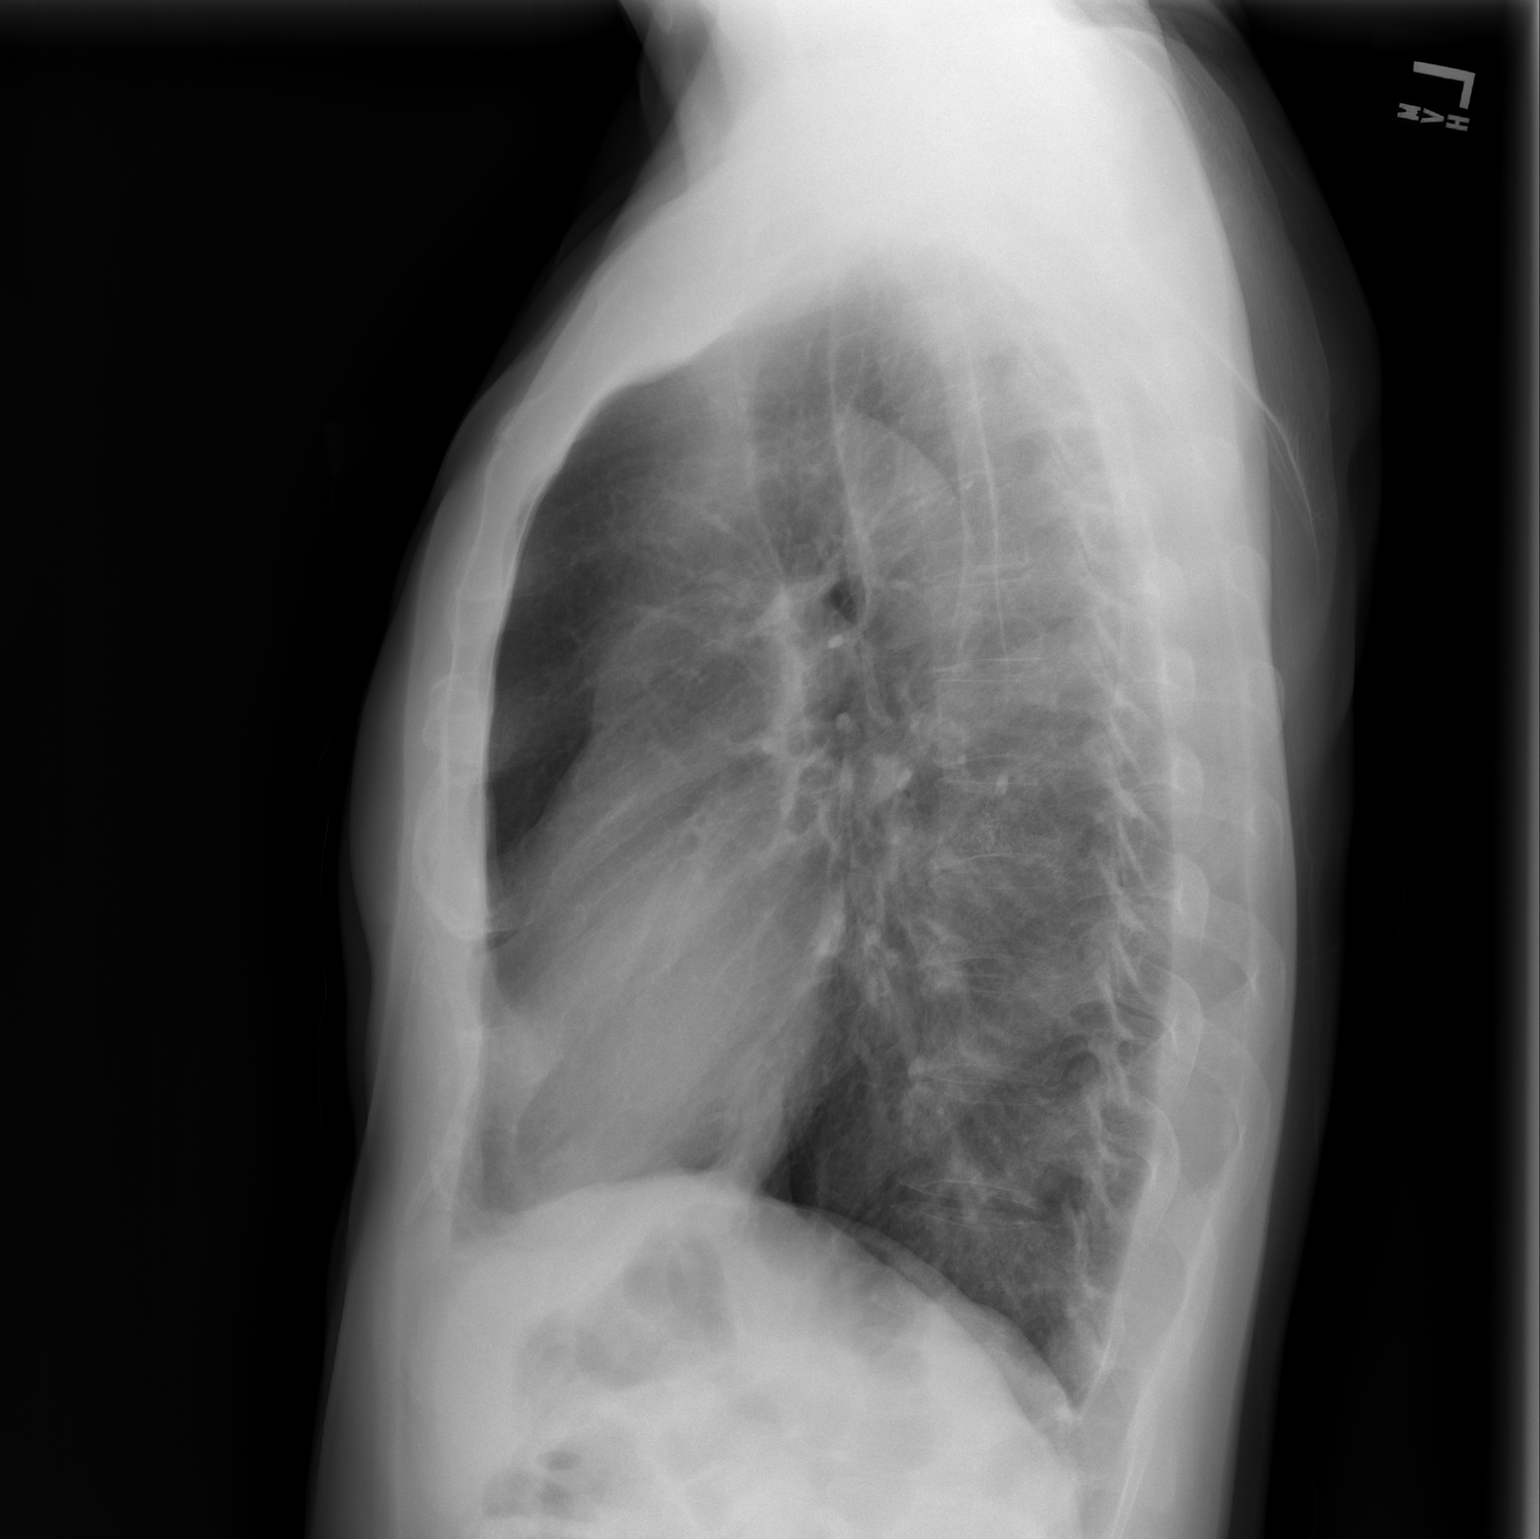

[2 of 2 positions shown; findings below may reference images not displayed]

FINDINGS: Stable cardiac and mediastinal contours. No consolidative pulmonary
opacities. No pleural effusion or pneumothorax.
IMPRESSION: No acute cardiopulmonary process.

## 2019-11-02 ENCOUNTER — Ambulatory Visit: Payer: 59 | Admitting: Emergency Medicine

## 2019-11-09 ENCOUNTER — Other Ambulatory Visit: Payer: Self-pay

## 2019-11-09 ENCOUNTER — Ambulatory Visit: Payer: 59 | Admitting: Emergency Medicine

## 2019-11-09 ENCOUNTER — Encounter: Payer: Self-pay | Admitting: Emergency Medicine

## 2019-11-09 VITALS — BP 134/71 | HR 60 | Temp 98.1°F | Ht 72.0 in | Wt 174.6 lb

## 2019-11-09 DIAGNOSIS — Z23 Encounter for immunization: Secondary | ICD-10-CM

## 2019-11-09 DIAGNOSIS — Z85810 Personal history of malignant neoplasm of tongue: Secondary | ICD-10-CM

## 2019-11-09 DIAGNOSIS — E890 Postprocedural hypothyroidism: Secondary | ICD-10-CM

## 2019-11-09 DIAGNOSIS — Z79899 Other long term (current) drug therapy: Secondary | ICD-10-CM

## 2019-11-09 DIAGNOSIS — D84821 Immunodeficiency due to drugs: Secondary | ICD-10-CM

## 2019-11-09 DIAGNOSIS — Z96643 Presence of artificial hip joint, bilateral: Secondary | ICD-10-CM

## 2019-11-09 DIAGNOSIS — Z940 Kidney transplant status: Secondary | ICD-10-CM | POA: Diagnosis not present

## 2019-11-09 DIAGNOSIS — Z7689 Persons encountering health services in other specified circumstances: Secondary | ICD-10-CM

## 2019-11-09 MED ORDER — LEVOTHYROXINE SODIUM 50 MCG PO TABS: 50.0000 ug | ORAL_TABLET | Freq: Every day | ORAL | 3 refills | Status: AC

## 2019-11-09 NOTE — Progress Notes (Signed)
[name] 59 y.o.   Chief Complaint  Patient presents with  . New Patient (Initial Visit)    HISTORY OF PRESENT ILLNESS: This is a 59 y.o. male here to establish care with me.  Has the following medical problems: 1.  Status post kidney transplant year 2000.  Sees nephrologist on a regular basis, Dr. Posey Mathews.  Doing well. 2.  History of tongue cancer diagnosed 5 years ago recently declared cancer free. 3.  Post radiation hypothyroidism.  Takes Synthroid 50 mcg daily. 4.  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 Doing well has no complaints or medical concerns.  Review of medication shows he is taking cyclosporine and losartan 50 mg daily to counteract hypertensive effect of cyclosporine. Fully vaccinated against Covid.  HPI   Prior to Admission medications   Medication Sig Start Date End Date Taking? Authorizing Provider  amoxicillin (AMOXIL) 500 MG capsule Take 500 mg by mouth 3 (three) times daily. 09/22/19  Yes [provider]  cycloSPORINE (SANDIMMUNE) 100 MG capsule Take 100 mg by mouth 2 (two) times daily. 125 mg in morning and  100 mg at night   Yes [provider]  cycloSPORINE modified (NEORAL) 25 MG capsule  03/04/17  Yes [provider]  levothyroxine (SYNTHROID) 50 MCG tablet Take 50 mcg by mouth daily. 11/09/19  Yes [provider]  losartan (COZAAR) 50 MG tablet  01/17/17  Yes [provider]  predniSONE (DELTASONE) 5 MG tablet Take 5 mg by mouth.   Yes [provider]    Allergies  Allergen Reactions  . Diflucan [Fluconazole] Other (See Comments)    Incompatible with cyclosporine  . Vancomycin Rash    Patient Active Problem List   Diagnosis Date Noted  . 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 HTN (hypertension)   . Renal disease   . Status post  radiation therapy 07/17/14-08/28/14   Tongue  . Tongue cancer [facility])    right    Past Surgical History:  Procedure Laterality Date  . HIP SURGERY     bone transplant per pt to both hips  . KIDNEY TRANSPLANT  11/1998  . NECK DISSECTION Bilateral 05/23/14  . PARTIAL GLOSSECTOMY Bilateral 05/23/14   Dr. Conley [facility] [employer]  . perm cath placed and removed    . SKIN GRAFT  05/23/14   split thickness skin graft neck poss  . submental flap  05/23/14  . TOTAL HIP ARTHROPLASTY Bilateral   . TRACHEOSTOMY  05/23/14    Social History   Socioeconomic History  . Marital status: Married    Spouse name: Not on file  . Number of children: 1  . Years of education: Not on file  . Highest education level: Not on file  Occupational History  . Not on file  Tobacco Use  . Smoking status: Never Smoker  . Smokeless tobacco: Former Systems developer    Types: Chew  Substance and Sexual Activity  . Alcohol use: Yes    Alcohol/week: 6.0 standard drinks    Types: 6 Glasses of wine per week  . Drug use: No  . Sexual activity: Not on file  Other Topics Concern  . Not on file  Social History Narrative  . Not on file   Social Determinants of Health   Financial Resource Strain:   . Difficulty of Paying  Living Expenses:   Food Insecurity:   . Worried About Charity fundraiser in the Last Year:   . Arboriculturist in the Last Year:   Transportation Needs:   . Film/video editor (Medical):   Marland Kitchen Lack of Transportation (Non-Medical):   Physical Activity:   . Days of Exercise per Week:   . Minutes of Exercise per Session:   Stress:   . Feeling of Stress :   Social Connections:   . Frequency of Communication with Friends and Family:   . Frequency of Social Gatherings with Friends and Family:   . Attends Religious Services:   . Active Member of Clubs or Organizations:   . Attends Archivist Meetings:   Marland Kitchen Marital Status:   Intimate Partner Violence:   . Fear of Current or Ex-Partner:   .  Emotionally Abused:   Marland Kitchen Physically Abused:   . Sexually Abused:     Family History  Problem Relation Age of Onset  . Thyroid disease Mother      Review of Systems  Constitutional: Negative.  Negative for chills and fever.  HENT: Negative.  Negative for congestion and sore throat.   Respiratory: Negative.  Negative for cough and shortness of breath.   Cardiovascular: Negative.  Negative for chest pain and palpitations.  Gastrointestinal: Negative.  Negative for abdominal pain, blood in stool, diarrhea, nausea and vomiting.  Genitourinary: Negative.  Negative for hematuria.  Musculoskeletal: Negative.  Negative for myalgias.  Skin: Negative.  Negative for rash.  Neurological: Negative for dizziness and headaches.  All other systems reviewed and are negative.  Today's Vitals   11/09/19 1551  BP: 134/71  Pulse: 60  Temp: 98.1 F (36.7 C)  TempSrc: Temporal  SpO2: 98%  Weight: 174 lb 9.6 oz (79.2 kg)  Height: 6' (1.829 m)   Body mass index is 23.68 kg/m.   Physical Exam Vitals reviewed.  Constitutional:      Appearance: Normal appearance.  HENT:     Head: Normocephalic.     Mouth/Throat:     Mouth: Mucous membranes are moist.     Pharynx: Oropharynx is clear.  Eyes:     Extraocular Movements: Extraocular movements intact.     Conjunctiva/sclera: Conjunctivae normal.     Pupils: Pupils are equal, round, and reactive to light.  Cardiovascular:     Rate and Rhythm: Normal rate and regular rhythm.     Pulses: Normal pulses.     Heart sounds: Normal heart sounds.  Pulmonary:     Effort: Pulmonary effort is normal.     Breath sounds: Normal breath sounds.  Musculoskeletal:        General: Normal range of motion.     Right lower leg: No edema.     Left lower leg: No edema.  Skin:    General: Skin is warm and dry.     Capillary Refill: Capillary refill takes less than 2 seconds.  Neurological:     General: No focal deficit present.     Mental Status: He is alert  and oriented to person, place, and time.  Psychiatric:        Mood and Affect: Mood normal.        Behavior: Behavior normal.      ASSESSMENT & PLAN: [name] was seen today for new patient (initial visit).  Diagnoses and all orders for this visit:  Encounter to establish care  History of kidney transplant  History of tongue cancer  Postablative hypothyroidism -     Comprehensive metabolic panel -     Lipid panel -     Hemoglobin A1c -     Thyroid Panel With TSH -     levothyroxine (SYNTHROID) 50 MCG tablet; Take 1 tablet (50 mcg total) by mouth daily.  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  If you have not heard from [place] then please contact us. The fastest way to get your results is to register for My Chart.   IF you received an x-ray today, you will receive an invoice from [facility]. Please contact [facility] at [phone] with questions or concerns regarding your invoice.   IF you received labwork today, you will receive an invoice from [facility]. Please contact [facility] at [phone] with questions or concerns regarding your invoice.   Our billing staff will not be able to assist you with questions regarding bills from these companies.  You will be contacted with the lab results as soon as they are available. The fastest way to get your results is to activate your My Chart account. Instructions are located on the last page of this paperwork. If you have not heard from [place] regarding the results in 2 weeks, please contact this office.     Health Maintenance, Male Adopting a healthy lifestyle and getting preventive care are important in promoting health and wellness.  Ask your health care provider about:  The right schedule for you to have regular tests and exams.  Things you can do on your own to prevent diseases and keep yourself healthy. What should I know about diet, weight, and exercise? Eat a healthy diet   Eat a diet that includes plenty of vegetables, fruits, low-fat dairy products, and lean protein.  Do not eat a lot of foods that are high in solid fats, added sugars, or sodium. Maintain a healthy weight Body mass index (BMI) is a measurement that can be used to identify possible weight problems. It estimates body fat based on height and weight. Your health care provider can help determine your BMI and help you achieve or maintain a healthy weight. Get regular exercise Get regular exercise. This is one of the most important things you can do for your health. Most adults should:  Exercise for at least 150 minutes each week. The exercise should increase your heart rate and make you sweat (moderate-intensity exercise).  Do strengthening exercises at least twice a week. This is in addition to the moderate-intensity exercise.  Spend less time sitting. Even light physical activity can be beneficial. Watch cholesterol and blood lipids Have your blood tested for lipids and cholesterol at 59 years of age, then have this test every 5 years. You may need to have your cholesterol levels checked more often if:  Your lipid or cholesterol levels are high.  You are older than 61 years of age.  You are at high risk for heart disease. What should I know about cancer screening? Many types of cancers can be detected early and may often be prevented. Depending on your health history and family history, you may need to have cancer screening at various ages. This may include  screening for:  Colorectal cancer.  Prostate cancer.  Skin cancer.  Lung cancer. What should I know about heart disease, diabetes, and high blood pressure? Blood pressure and heart  disease  High blood pressure causes heart disease and increases the risk of stroke. This is more likely to develop in people who have high blood pressure readings, are of African descent, or are overweight.  Talk with your health care provider about your target blood pressure readings.  Have your blood pressure checked: ? Every 3-5 years if you are 20-67 years of age. ? Every year if you are 26 years old or older.  If you are between the ages of 79 and 32 and are a current or former smoker, ask your health care provider if you should have a one-time screening for abdominal aortic aneurysm (AAA). Diabetes Have regular diabetes screenings. This checks your fasting blood sugar level. Have the screening done:  Once every three years after age 52 if you are at a normal weight and have a low risk for diabetes.  More often and at a younger age if you are overweight or have a high risk for diabetes. What should I know about preventing infection? Hepatitis B If you have a higher risk for hepatitis B, you should be screened for this virus. Talk with your health care provider to find out if you are at risk for hepatitis B infection. Hepatitis C Blood testing is recommended for:  Everyone born from [birth year] through 1965.  Anyone with known risk factors for hepatitis C. Sexually transmitted infections (STIs)  You should be screened each year for STIs, including gonorrhea and chlamydia, if: ? You are sexually active and are younger than 61 years of age. ? You are older than 61 years of age and your health care provider tells you that you are at risk for this type of infection. ? Your sexual activity has changed since you were last screened, and you are at increased risk for chlamydia or gonorrhea. Ask your health care provider if you are at risk.  Ask your health care provider about whether you are at high risk for HIV. Your health care provider may recommend a prescription medicine to help prevent  HIV infection. If you choose to take medicine to prevent HIV, you should first get tested for HIV. You should then be tested every 3 months for as long as you are taking the medicine. Follow these instructions at home: Lifestyle  Do not use any products that contain nicotine or tobacco, such as cigarettes, e-cigarettes, and chewing tobacco. If you need help quitting, ask your health care provider.  Do not use street drugs.  Do not share needles.  Ask your health care provider for help if you need support or information about quitting drugs. Alcohol use  Do not drink alcohol if your health care provider tells you not to drink.  If you drink alcohol: ? Limit how much you have to 0-2 drinks a day. ? Be aware of how much alcohol is in your drink. In the [place] one drink equals one 12 oz bottle of beer (355 mL), one 5 oz glass of wine (148 mL), or one 1 oz glass of hard liquor (44 mL). General instructions  Schedule regular health, dental, and eye exams.  Stay current with your vaccines.  Tell your health care provider if: ? You often feel depressed. ? You have ever been abused or do not feel safe at home. Summary  Adopting a healthy  lifestyle and getting preventive care are important in promoting health and wellness.  Follow your health care provider's instructions about healthy diet, exercising, and getting tested or screened for diseases.  Follow your health care provider's instructions on monitoring your cholesterol and blood pressure. This information is not intended to replace advice given to you by your health care provider. Make sure you discuss any questions you have with your health care provider. Document Revised: 06/16/2018 Document Reviewed: 06/16/2018 Elsevier Patient Education  2020 Elsevier Inc.      Agustina Caroli, MD [facility]

## 2019-11-09 NOTE — Patient Instructions (Addendum)
   If you have lab work done today you will be contacted with your lab results within the next 2 weeks.  If you have not heard from us then please contact us. The fastest way to get your results is to register for My Chart.   IF you received an x-ray today, you will receive an invoice from [facility]. Please contact [facility] at [phone] with questions or concerns regarding your invoice.   IF you received labwork today, you will receive an invoice from [facility]. Please contact [facility] at [phone] with questions or concerns regarding your invoice.   Our billing staff will not be able to assist you with questions regarding bills from these companies.  You will be contacted with the lab results as soon as they are available. The fastest way to get your results is to activate your My Chart account. Instructions are located on the last page of this paperwork. If you have not heard from us regarding the results in 2 weeks, please contact this office.      Health Maintenance, Male Adopting a healthy lifestyle and getting preventive care are important in promoting health and wellness. Ask your health care provider about:  The right schedule for you to have regular tests and exams.  Things you can do on your own to prevent diseases and keep yourself healthy. What should I know about diet, weight, and exercise? Eat a healthy diet   Eat a diet that includes plenty of vegetables, fruits, low-fat dairy products, and lean protein.  Do not eat a lot of foods that are high in solid fats, added sugars, or sodium. Maintain a healthy weight Body mass index (BMI) is a measurement that can be used to identify possible weight problems. It estimates body fat based on height and weight. Your health care provider can help determine your BMI and help you achieve or maintain a healthy weight. Get regular exercise Get regular exercise. This is one of the most important things you  can do for your health. Most adults should:  Exercise for at least 150 minutes each week. The exercise should increase your heart rate and make you sweat (moderate-intensity exercise).  Do strengthening exercises at least twice a week. This is in addition to the moderate-intensity exercise.  Spend less time sitting. Even light physical activity can be beneficial. Watch cholesterol and blood lipids Have your blood tested for lipids and cholesterol at 59 years of age, then have this test every 5 years. You may need to have your cholesterol levels checked more often if:  Your lipid or cholesterol levels are high.  You are older than 61 years of age.  You are at high risk for heart disease. What should I know about cancer screening? Many types of cancers can be detected early and may often be prevented. Depending on your health history and family history, you may need to have cancer screening at various ages. This may include screening for:  Colorectal cancer.  Prostate cancer.  Skin cancer.  Lung cancer. What should I know about heart disease, diabetes, and high blood pressure? Blood pressure and heart disease  High blood pressure causes heart disease and increases the risk of stroke. This is more likely to develop in people who have high blood pressure readings, are of African descent, or are overweight.  Talk with your health care provider about your target blood pressure readings.  Have your blood pressure checked: ? Every 3-5 years if you are 18-39   years of age. ? Every year if you are 40 years old or older.  If you are between the ages of 65 and 75 and are a current or former smoker, ask your health care provider if you should have a one-time screening for abdominal aortic aneurysm (AAA). Diabetes Have regular diabetes screenings. This checks your fasting blood sugar level. Have the screening done:  Once every three years after age 45 if you are at a normal weight and have  a low risk for diabetes.  More often and at a younger age if you are overweight or have a high risk for diabetes. What should I know about preventing infection? Hepatitis B If you have a higher risk for hepatitis B, you should be screened for this virus. Talk with your health care provider to find out if you are at risk for hepatitis B infection. Hepatitis C Blood testing is recommended for:  Everyone born from 1945 through 1965.  Anyone with known risk factors for hepatitis C. Sexually transmitted infections (STIs)  You should be screened each year for STIs, including gonorrhea and chlamydia, if: ? You are sexually active and are younger than 61 years of age. ? You are older than 61 years of age and your health care provider tells you that you are at risk for this type of infection. ? Your sexual activity has changed since you were last screened, and you are at increased risk for chlamydia or gonorrhea. Ask your health care provider if you are at risk.  Ask your health care provider about whether you are at high risk for HIV. Your health care provider may recommend a prescription medicine to help prevent HIV infection. If you choose to take medicine to prevent HIV, you should first get tested for HIV. You should then be tested every 3 months for as long as you are taking the medicine. Follow these instructions at home: Lifestyle  Do not use any products that contain nicotine or tobacco, such as cigarettes, e-cigarettes, and chewing tobacco. If you need help quitting, ask your health care provider.  Do not use street drugs.  Do not share needles.  Ask your health care provider for help if you need support or information about quitting drugs. Alcohol use  Do not drink alcohol if your health care provider tells you not to drink.  If you drink alcohol: ? Limit how much you have to 0-2 drinks a day. ? Be aware of how much alcohol is in your drink. In the [place] one drink equals one 12  oz bottle of beer (355 mL), one 5 oz glass of wine (148 mL), or one 1 oz glass of hard liquor (44 mL). General instructions  Schedule regular health, dental, and eye exams.  Stay current with your vaccines.  Tell your health care provider if: ? You often feel depressed. ? You have ever been abused or do not feel safe at home. Summary  Adopting a healthy lifestyle and getting preventive care are important in promoting health and wellness.  Follow your health care provider's instructions about healthy diet, exercising, and getting tested or screened for diseases.  Follow your health care provider's instructions on monitoring your cholesterol and blood pressure. This information is not intended to replace advice given to you by your health care provider. Make sure you discuss any questions you have with your health care provider. Document Revised: 06/16/2018 Document Reviewed: 06/16/2018 Elsevier Patient Education  2020 [employer]vier Inc.  

## 2019-11-10 ENCOUNTER — Encounter: Payer: Self-pay | Admitting: Emergency Medicine

## 2019-11-10 LAB — THYROID PANEL WITH TSH
Free Thyroxine Index: 1.3 (ref 1.2–4.9)
T3 Uptake Ratio: 28 % (ref 24–39)
T4, Total: 4.8 ug/dL (ref 4.5–12.0)
TSH: 3.24 u[IU]/mL (ref 0.450–4.500)

## 2019-11-10 LAB — COMPREHENSIVE METABOLIC PANEL
ALT: 65 IU/L — ABNORMAL HIGH (ref 0–44)
AST: 44 IU/L — ABNORMAL HIGH (ref 0–40)
Albumin/Globulin Ratio: 1.6 (ref 1.2–2.2)
Albumin: 3.9 g/dL (ref 3.8–4.9)
Alkaline Phosphatase: 86 IU/L (ref 39–117)
BUN/Creatinine Ratio: 24 (ref 10–24)
BUN: 25 mg/dL (ref 8–27)
Bilirubin Total: 0.5 mg/dL (ref 0.0–1.2)
CO2: 26 mmol/L (ref 20–29)
Calcium: 9.5 mg/dL (ref 8.6–10.2)
Chloride: 97 mmol/L (ref 96–106)
Creatinine, Ser: 1.05 mg/dL (ref 0.76–1.27)
GFR calc Af Amer: 89 mL/min/{1.73_m2} (ref >59)
GFR calc non Af Amer: 77 mL/min/{1.73_m2} (ref >59)
Globulin, Total: 2.5 g/dL (ref 1.5–4.5)
Glucose: 93 mg/dL (ref 65–99)
Potassium: 5.1 mmol/L (ref 3.5–5.2)
Sodium: 135 mmol/L (ref 134–144)
Total Protein: 6.4 g/dL (ref 6.0–8.5)

## 2019-11-10 LAB — LIPID PANEL
Chol/HDL Ratio: 2.2 ratio (ref 0.0–5.0)
Cholesterol, Total: 193 mg/dL (ref 100–199)
HDL: 87 mg/dL (ref >39)
LDL Chol Calc (NIH): 89 mg/dL (ref 0–99)
Triglycerides: 98 mg/dL (ref 0–149)
VLDL Cholesterol Cal: 17 mg/dL (ref 5–40)

## 2019-11-10 LAB — HEMOGLOBIN A1C
Est. average glucose Bld gHb Est-mCnc: 111 mg/dL
Hgb A1c MFr Bld: 5.5 % (ref 4.8–5.6)

## 2019-11-21 ENCOUNTER — Encounter: Payer: Self-pay | Admitting: *Deleted

## 2020-01-05 ENCOUNTER — Telehealth (INDEPENDENT_AMBULATORY_CARE_PROVIDER_SITE_OTHER): Payer: 59 | Admitting: Emergency Medicine

## 2020-01-05 ENCOUNTER — Other Ambulatory Visit: Payer: Self-pay

## 2020-01-05 VITALS — BP 129/72 | Temp 97.0°F | Ht 72.0 in | Wt 170.0 lb

## 2020-01-05 DIAGNOSIS — R197 Diarrhea, unspecified: Secondary | ICD-10-CM

## 2020-01-05 DIAGNOSIS — K529 Noninfective gastroenteritis and colitis, unspecified: Secondary | ICD-10-CM | POA: Diagnosis not present

## 2020-01-05 DIAGNOSIS — R140 Abdominal distension (gaseous): Secondary | ICD-10-CM

## 2020-01-05 DIAGNOSIS — Z940 Kidney transplant status: Secondary | ICD-10-CM | POA: Diagnosis not present

## 2020-01-05 DIAGNOSIS — D84821 Immunodeficiency due to drugs: Secondary | ICD-10-CM

## 2020-01-05 DIAGNOSIS — Z85810 Personal history of malignant neoplasm of tongue: Secondary | ICD-10-CM

## 2020-01-05 DIAGNOSIS — E890 Postprocedural hypothyroidism: Secondary | ICD-10-CM

## 2020-01-05 DIAGNOSIS — Z79899 Other long term (current) drug therapy: Secondary | ICD-10-CM

## 2020-01-05 NOTE — Progress Notes (Signed)
Telemedicine Encounter- SOAP NOTE Established Patient MyChart video conference This video telephone encounter was conducted with the patient's (or proxy's) verbal consent via video audio telecommunications: yes/no: Yes Patient was instructed to have this encounter in a suitably private space; and to only have persons present to whom they give permission to participate. In addition, patient identity was confirmed by use of name plus two identifiers (DOB and address).  I discussed the limitations, risks, security and privacy concerns of performing an evaluation and management service by telephone and the availability of in person appointments. I also discussed with the patient that there may be a patient responsible charge related to this service. The patient expressed understanding and agreed to proceed.  I spent a total of TIME; 0 MIN TO 60 MIN: 20 minutes talking with the patient or their proxy.  Chief Complaint  Patient presents with  . Diarrhea    started on Sunday - per pt when he eat it causes gas and he starts to feel bad  . Emesis    Sunday    Subjective   [name] is a 59 y.o. male established patient. Telephone visit today complaining of bloating and diarrhea.  It started last Sunday 3 to 4 hours after having a meal.  Developed upset stomach with nausea.  Vomited once.  Followed by watery nonbloody diarrhea and generalized bloating.  Today feels better than 2 to 3 days ago.  Slowly getting better.  No longer nauseous or vomiting.  Still has some bloating and loose bowel movements.  Denies fever or chills.  No other significant associated symptoms.  HPI   Patient Active Problem List   Diagnosis Date Noted  . 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 HTN (hypertension)   . Hypertension    Phreesia 01/05/2020  . Renal disease   . Status  post radiation therapy 07/17/14-08/28/14   Tongue  . Tongue cancer ([place])    right    Current Outpatient Medications  Medication Sig Dispense Refill  . amoxicillin (AMOXIL) 500 MG capsule Take 500 mg by mouth as needed.    . cycloSPORINE (SANDIMMUNE) 100 MG capsule Take 100 mg by mouth 2 (two) times daily. 125 mg in morning and  100 mg at night    . cycloSPORINE modified (NEORAL) 25 MG capsule     . levothyroxine (SYNTHROID) 50 MCG tablet Take 1 tablet (50 mcg total) by mouth daily. 90 tablet 3  . losartan (COZAAR) 50 MG tablet     . predniSONE (DELTASONE) 5 MG tablet Take 5 mg by mouth.     No current facility-administered medications for this visit.    Allergies  Allergen Reactions  . Other Rash  . Diflucan [Fluconazole] Other (See Comments)    Incompatible with cyclosporine  . Vancomycin Rash    Social History   Socioeconomic History  . Marital status: Married    Spouse name: Not on file  . Number of children: 1  . Years of education: Not on file  . Highest education level: Not on file  Occupational History  . Not on file  Tobacco Use  . Smoking status: Never Smoker  . Smokeless tobacco: Former Systems developer    Types: Chew  Substance and Sexual Activity  . Alcohol use: Yes    Alcohol/week: 6.0 standard drinks    Types: 6  Glasses of wine per week  . Drug use: No  . Sexual activity: Not on file  Other Topics Concern  . Not on file  Social History Narrative  . Not on file   Social Determinants of Health   Financial Resource Strain:   . Difficulty of Paying Living Expenses:   Food Insecurity:   . Worried About Charity fundraiser in the Last Year:   . Arboriculturist in the Last Year:   Transportation Needs:   . Film/video editor (Medical):   Marland Kitchen Lack of Transportation (Non-Medical):   Physical Activity:   . Days of Exercise per Week:   . Minutes of Exercise per Session:   Stress:   . Feeling of Stress :   Social Connections:   . Frequency of Communication  with Friends and Family:   . Frequency of Social Gatherings with Friends and Family:   . Attends Religious Services:   . Active Member of Clubs or Organizations:   . Attends Archivist Meetings:   Marland Kitchen Marital Status:   Intimate Partner Violence:   . Fear of Current or Ex-Partner:   . Emotionally Abused:   Marland Kitchen Physically Abused:   . Sexually Abused:     Review of Systems  Constitutional: Negative.  Negative for chills and fever.  HENT: Negative.  Negative for congestion and sore throat.   Respiratory: Negative.  Negative for cough and shortness of breath.   Cardiovascular: Negative.  Negative for chest pain and palpitations.  Gastrointestinal: Positive for abdominal pain, diarrhea, nausea and vomiting. Negative for blood in stool and melena.  Genitourinary: Negative.  Negative for dysuria and hematuria.  Musculoskeletal: Negative.  Negative for back pain, myalgias and neck pain.  Skin: Negative.  Negative for rash.  Neurological: Negative.  Negative for dizziness and headaches.  All other systems reviewed and are negative.   Objective  Alert and oriented x3 in no apparent respiratory distress. Vitals as reported by the patient: Today's Vitals   01/05/20 1654  BP: 129/72  Temp: (!) 97 F (36.1 C)  TempSrc: Temporal  Weight: 170 lb (77.1 kg)  Height: 6' (1.829 m)    There are no diagnoses linked to this encounter.  Santiel was seen today for diarrhea and emesis.  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  Advised about dehydration.  Advised to take probiotics. Contact the office if no better or worse during the next several days.  I discussed the assessment and treatment plan  with the patient. The patient was provided an opportunity to ask questions and all were answered. The patient agreed with the plan and demonstrated an understanding of the instructions.   The patient was advised to call back or seek an in-person evaluation if the symptoms worsen or if the condition fails to improve as anticipated.  I provided 20 minutes of non-face-to-face time during this encounter.  Horald Pollen, MD  Primary Care at [facility]

## 2020-01-05 NOTE — Patient Instructions (Signed)
° ° ° °  If you have lab work done today you will be contacted with your lab results within the next 2 weeks.  If you have not heard from us then please contact us. The fastest way to get your results is to register for My Chart. ° ° °IF you received an x-ray today, you will receive an invoice from [facility]. Please contact [facility] at [phone] with questions or concerns regarding your invoice.  ° °IF you received labwork today, you will receive an invoice from [facility]. Please contact [facility] at [phone] with questions or concerns regarding your invoice.  ° °Our billing staff will not be able to assist you with questions regarding bills from these companies. ° °You will be contacted with the lab results as soon as they are available. The fastest way to get your results is to activate your My Chart account. Instructions are located on the last page of this paperwork. If you have not heard from us regarding the results in 2 weeks, please contact this office. °  ° ° ° °

## 2020-01-27 ENCOUNTER — Other Ambulatory Visit: Payer: Self-pay

## 2020-01-27 ENCOUNTER — Encounter (HOSPITAL_BASED_OUTPATIENT_CLINIC_OR_DEPARTMENT_OTHER): Payer: Self-pay

## 2020-01-27 ENCOUNTER — Ambulatory Visit (HOSPITAL_COMMUNITY)
Admission: EM | Admit: 2020-01-27 | Discharge: 2020-01-27 | Disposition: A | Discharge status: Home / Self Care | Payer: Medicare Other | Source: Home / Self Care | Attending: Family Medicine | Admitting: Family Medicine

## 2020-01-27 ENCOUNTER — Encounter (HOSPITAL_COMMUNITY): Payer: Self-pay

## 2020-01-27 ENCOUNTER — Inpatient Hospital Stay (HOSPITAL_BASED_OUTPATIENT_CLINIC_OR_DEPARTMENT_OTHER)
Admission: EM | Admit: 2020-01-27 | Discharge: 2020-01-30 | DRG: 418 | Disposition: A | Discharge status: Home / Self Care | Payer: Medicare Other | Attending: Internal Medicine | Admitting: Internal Medicine

## 2020-01-27 DIAGNOSIS — E860 Dehydration: Secondary | ICD-10-CM | POA: Diagnosis present

## 2020-01-27 DIAGNOSIS — K810 Acute cholecystitis: Secondary | ICD-10-CM | POA: Diagnosis not present

## 2020-01-27 DIAGNOSIS — I10 Essential (primary) hypertension: Secondary | ICD-10-CM | POA: Diagnosis present

## 2020-01-27 DIAGNOSIS — K819 Cholecystitis, unspecified: Secondary | ICD-10-CM

## 2020-01-27 DIAGNOSIS — Z20822 Contact with and (suspected) exposure to covid-19: Secondary | ICD-10-CM | POA: Diagnosis present

## 2020-01-27 DIAGNOSIS — E871 Hypo-osmolality and hyponatremia: Secondary | ICD-10-CM | POA: Diagnosis present

## 2020-01-27 DIAGNOSIS — Z96643 Presence of artificial hip joint, bilateral: Secondary | ICD-10-CM | POA: Diagnosis present

## 2020-01-27 DIAGNOSIS — Z85810 Personal history of malignant neoplasm of tongue: Secondary | ICD-10-CM

## 2020-01-27 DIAGNOSIS — Z8349 Family history of other endocrine, nutritional and metabolic diseases: Secondary | ICD-10-CM

## 2020-01-27 DIAGNOSIS — N179 Acute kidney failure, unspecified: Secondary | ICD-10-CM | POA: Diagnosis not present

## 2020-01-27 DIAGNOSIS — R109 Unspecified abdominal pain: Secondary | ICD-10-CM

## 2020-01-27 DIAGNOSIS — K8000 Calculus of gallbladder with acute cholecystitis without obstruction: Secondary | ICD-10-CM | POA: Diagnosis not present

## 2020-01-27 DIAGNOSIS — Z91041 Radiographic dye allergy status: Secondary | ICD-10-CM

## 2020-01-27 DIAGNOSIS — Z7952 Long term (current) use of systemic steroids: Secondary | ICD-10-CM

## 2020-01-27 DIAGNOSIS — T508X5A Adverse effect of diagnostic agents, initial encounter: Secondary | ICD-10-CM | POA: Diagnosis not present

## 2020-01-27 DIAGNOSIS — T8619 Other complication of kidney transplant: Secondary | ICD-10-CM | POA: Diagnosis not present

## 2020-01-27 DIAGNOSIS — Z881 Allergy status to other antibiotic agents status: Secondary | ICD-10-CM

## 2020-01-27 DIAGNOSIS — Z79890 Hormone replacement therapy (postmenopausal): Secondary | ICD-10-CM

## 2020-01-27 DIAGNOSIS — Z888 Allergy status to other drugs, medicaments and biological substances status: Secondary | ICD-10-CM

## 2020-01-27 DIAGNOSIS — K82A1 Gangrene of gallbladder in cholecystitis: Secondary | ICD-10-CM | POA: Diagnosis present

## 2020-01-27 DIAGNOSIS — E861 Hypovolemia: Secondary | ICD-10-CM | POA: Diagnosis present

## 2020-01-27 DIAGNOSIS — Z940 Kidney transplant status: Secondary | ICD-10-CM

## 2020-01-27 DIAGNOSIS — D649 Anemia, unspecified: Secondary | ICD-10-CM | POA: Diagnosis present

## 2020-01-27 DIAGNOSIS — Z87891 Personal history of nicotine dependence: Secondary | ICD-10-CM

## 2020-01-27 DIAGNOSIS — D84821 Immunodeficiency due to drugs: Secondary | ICD-10-CM | POA: Diagnosis present

## 2020-01-27 DIAGNOSIS — E039 Hypothyroidism, unspecified: Secondary | ICD-10-CM | POA: Diagnosis present

## 2020-01-27 DIAGNOSIS — Z79899 Other long term (current) drug therapy: Secondary | ICD-10-CM

## 2020-01-27 DIAGNOSIS — Z923 Personal history of irradiation: Secondary | ICD-10-CM

## 2020-01-27 DIAGNOSIS — Y830 Surgical operation with transplant of whole organ as the cause of abnormal reaction of the patient, or of later complication, without mention of misadventure at the time of the procedure: Secondary | ICD-10-CM | POA: Diagnosis not present

## 2020-01-27 DIAGNOSIS — I120 Hypertensive chronic kidney disease with stage 5 chronic kidney disease or end stage renal disease: Secondary | ICD-10-CM | POA: Diagnosis present

## 2020-01-27 LAB — CBC WITH DIFFERENTIAL/PLATELET
Abs Immature Granulocytes: 0.04 10*3/uL (ref 0.00–0.07)
Basophils Absolute: 0 10*3/uL (ref 0.0–0.1)
Basophils Relative: 0 %
Eosinophils Absolute: 0 10*3/uL (ref 0.0–0.5)
Eosinophils Relative: 0 %
HCT: 35.9 % — ABNORMAL LOW (ref 39.0–52.0)
Hemoglobin: 12 g/dL — ABNORMAL LOW (ref 13.0–17.0)
Immature Granulocytes: 0 %
Lymphocytes Relative: 6 %
Lymphs Abs: 0.6 10*3/uL — ABNORMAL LOW (ref 0.7–4.0)
MCH: 31.9 pg (ref 26.0–34.0)
MCHC: 33.4 g/dL (ref 30.0–36.0)
MCV: 95.5 fL (ref 80.0–100.0)
Monocytes Absolute: 0.9 10*3/uL (ref 0.1–1.0)
Monocytes Relative: 9 %
Neutro Abs: 8.4 10*3/uL — ABNORMAL HIGH (ref 1.7–7.7)
Neutrophils Relative %: 85 %
Platelets: 205 10*3/uL (ref 150–400)
RBC: 3.76 MIL/uL — ABNORMAL LOW (ref 4.22–5.81)
RDW: 12.8 % (ref 11.5–15.5)
WBC: 10 10*3/uL (ref 4.0–10.5)
nRBC: 0 % (ref 0.0–0.2)

## 2020-01-27 LAB — COMPREHENSIVE METABOLIC PANEL
ALT: 28 U/L (ref 0–44)
AST: 27 U/L (ref 15–41)
Albumin: 3.7 g/dL (ref 3.5–5.0)
Alkaline Phosphatase: 56 U/L (ref 38–126)
Anion gap: 11 (ref 5–15)
BUN: 24 mg/dL — ABNORMAL HIGH (ref 6–20)
CO2: 24 mmol/L (ref 22–32)
Calcium: 8.9 mg/dL (ref 8.9–10.3)
Chloride: 97 mmol/L — ABNORMAL LOW (ref 98–111)
Creatinine, Ser: 1.01 mg/dL (ref 0.61–1.24)
GFR calc Af Amer: >60 mL/min (ref >60)
GFR calc non Af Amer: >60 mL/min (ref >60)
Glucose, Bld: 137 mg/dL — ABNORMAL HIGH (ref 70–99)
Potassium: 3.8 mmol/L (ref 3.5–5.1)
Sodium: 132 mmol/L — ABNORMAL LOW (ref 135–145)
Total Bilirubin: 0.9 mg/dL (ref 0.3–1.2)
Total Protein: 6.8 g/dL (ref 6.5–8.1)

## 2020-01-27 LAB — URINALYSIS, ROUTINE W REFLEX MICROSCOPIC
Bilirubin Urine: NEGATIVE
Glucose, UA: NEGATIVE mg/dL
Ketones, ur: NEGATIVE mg/dL
Leukocytes,Ua: NEGATIVE
Nitrite: NEGATIVE
Protein, ur: NEGATIVE mg/dL
Specific Gravity, Urine: 1.02 (ref 1.005–1.030)
pH: 7.5 (ref 5.0–8.0)

## 2020-01-27 LAB — URINALYSIS, MICROSCOPIC (REFLEX)

## 2020-01-27 LAB — LIPASE, BLOOD: Lipase: 44 U/L (ref 11–51)

## 2020-01-27 MED ORDER — LIDOCAINE VISCOUS HCL 2 % MT SOLN
15.0000 mL | Freq: Once | OROMUCOSAL | Status: AC
  Administered 2020-01-27: 15 mL via ORAL

## 2020-01-27 MED ORDER — ONDANSETRON HCL 4 MG/2ML IJ SOLN
INTRAMUSCULAR | Status: AC
  Filled 2020-01-27: qty 2

## 2020-01-27 MED ORDER — ONDANSETRON HCL 4 MG/2ML IJ SOLN
4.0000 mg | Freq: Once | INTRAMUSCULAR | Status: AC
  Administered 2020-01-27: 4 mg via INTRAMUSCULAR

## 2020-01-27 MED ORDER — LIDOCAINE VISCOUS HCL 2 % MT SOLN
OROMUCOSAL | Status: AC
  Filled 2020-01-27: qty 15

## 2020-01-27 MED ORDER — SODIUM CHLORIDE 0.9 % IV SOLN: Freq: Once | INTRAVENOUS | Status: AC

## 2020-01-27 MED ORDER — HYDROMORPHONE HCL 1 MG/ML IJ SOLN
0.5000 mg | Freq: Once | INTRAMUSCULAR | Status: AC
  Administered 2020-01-27: 0.5 mg via INTRAVENOUS

## 2020-01-27 MED ORDER — ALUM & MAG HYDROXIDE-SIMETH 200-200-20 MG/5ML PO SUSP
ORAL | Status: AC
  Filled 2020-01-27: qty 30

## 2020-01-27 MED ORDER — FENTANYL CITRATE (PF) 100 MCG/2ML IJ SOLN
50.0000 ug | INTRAMUSCULAR | Status: DC | PRN
  Administered 2020-01-27: 50 ug via INTRAVENOUS
  Filled 2020-01-27: qty 2

## 2020-01-27 MED ORDER — HYDROMORPHONE HCL 1 MG/ML IJ SOLN
INTRAMUSCULAR | Status: AC
  Filled 2020-01-27: qty 0.5

## 2020-01-27 MED ORDER — ALUM & MAG HYDROXIDE-SIMETH 200-200-20 MG/5ML PO SUSP
30.0000 mL | Freq: Once | ORAL | Status: AC
  Administered 2020-01-27: 30 mL via ORAL

## 2020-01-27 NOTE — ED Triage Notes (Signed)
Pt presents with generalized abdominal cramping and vomiting since earlier this evening.  Pt is immunocompromised.

## 2020-01-27 NOTE — ED Notes (Signed)
Pt given of ice chips and warm blanket per provider order.

## 2020-01-27 NOTE — ED Triage Notes (Signed)
Pt presents with epigastric pain and vomiting that started today. Pt is on immunosuppressants r/t hx of kidney transplant.

## 2020-01-27 NOTE — Discharge Instructions (Addendum)
I recommend you go to the ER

## 2020-01-27 NOTE — ED Provider Notes (Signed)
[facility]    CSN: [id number] Arrival date & time: 01/27/20  1659      History   Chief Complaint Chief Complaint  Patient presents with  . Abdominal Pain  . Vomiting    HPI [name] is a 59 y.o. male.   HPI  Very pleasant gentleman here with abdominal pain and vomiting.  He has been having continuous vomiting this afternoon.  He states that he felt well when he went to work this morning.  He went home for lunch.  He states that he drove back to his office when he got out of the car, he started having pain.  Shortly thereafter developed vomiting.  Has been vomiting consistently until this evening.  No fever or chills.  No change in bowels or blood in bowels.  No history of abdominal disease.  No history of abdominal surgery. Patient is a kidney transplant patient.  He is on immunosuppressants.  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  He is on a soft diet.  He is not currently under treatment for his oral cancer or taking any medications that would cause vomiting. He has not been around anyone who is ill He has not eaten any foods that are suspicious He has had both of his Covid vaccinations  Past Medical History:  Diagnosis Date  . 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 HTN (hypertension)   . Hypertension    Phreesia 01/05/2020  . Renal disease   . Status post radiation therapy 07/17/14-08/28/14   Tongue  . Tongue cancer [facility])    right    Patient Active Problem List   Diagnosis Date Noted  . 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 HIP SURGERY     bone transplant per pt to both hips  . JOINT REPLACEMENT N/A    Phreesia 01/05/2020  . KIDNEY TRANSPLANT  11/1998  . NECK DISSECTION Bilateral 05/23/14  . PARTIAL GLOSSECTOMY Bilateral 05/23/14   Dr. Conley [facility]  . perm cath placed and removed    .  SKIN GRAFT  05/23/14   split thickness skin graft neck poss  . submental flap  05/23/14  . TOTAL HIP ARTHROPLASTY Bilateral   . TRACHEOSTOMY  05/23/14       Home Medications    Prior to Admission medications   Medication Sig Start Date End Date Taking? Authorizing Provider  amoxicillin (AMOXIL) 500 MG capsule Take 500 mg by mouth as needed.    [provider]  cycloSPORINE (SANDIMMUNE) 100 MG capsule Take 100 mg by mouth 2 (two) times daily. 125 mg in morning and  100 mg at night    [provider]  cycloSPORINE modified (NEORAL) 25 MG capsule  03/04/17   [provider]  levothyroxine (SYNTHROID) 50 MCG tablet Take 1 tablet (50 mcg total) by mouth daily. 11/09/19 02/07/20  Horald Pollen, MD  losartan (COZAAR) 50 MG tablet  01/17/17   [provider]  predniSONE (DELTASONE) 5 MG tablet Take 5 mg by mouth.    [provider]    Family History Family History  Problem Relation Age of Onset  . Thyroid disease Mother     Social History Social History   Tobacco Use  . Smoking status: Never Smoker  . Smokeless tobacco: Former Systems developer    Types: Loss adjuster, chartered  Substance Use Topics  . Alcohol use: Yes    Alcohol/week: 6.0 standard drinks    Types: 6 Glasses of wine per week  . Drug use: No     Allergies   Diflucan [fluconazole] and Vancomycin   Review of Systems Review of Systems See HPI  Physical Exam Triage Vital Signs ED Triage Vitals  Enc Vitals Group     BP 01/27/20 1901 (!) 140/80     Pulse Rate 01/27/20 1901 74     Resp 01/27/20 1901 16     Temp 01/27/20 1901 97.7 F (36.5 C)     Temp Source 01/27/20 1901 Oral     SpO2 01/27/20 1901 100 %     Weight --      Height --      Head Circumference --      Peak Flow --      Pain Score 01/27/20 1859 7     Pain Loc --      Pain Edu? --      Excl. in [place]? --    No data found.  Updated Vital Signs BP (!) 140/80 (BP Location: Right Arm)   Pulse 83   Temp 98.2 F (36.8 C)  (Oral)   Resp 16   SpO2 97%       Physical Exam Constitutional:      General: He is in acute distress.     Appearance: He is well-developed. He is ill-appearing and diaphoretic.     Comments: Writhing on bed.  Acutely uncomfortable.  Heaving scant liquids  HENT:     Head: Normocephalic and atraumatic.     Mouth/Throat:     Comments: Tongue is dry.  Deformity. Eyes:     Conjunctiva/sclera: Conjunctivae normal.     Pupils: Pupils are equal, round, and reactive to light.  Cardiovascular:     Rate and Rhythm: Normal rate and regular rhythm.     Heart sounds: Normal heart sounds.  Pulmonary:     Effort: Pulmonary effort is normal. No respiratory distress.     Breath sounds: Normal breath sounds.  Abdominal:     General: Abdomen is flat. Bowel sounds are decreased. There is no distension.     Palpations: Abdomen is soft. There is no hepatomegaly, splenomegaly or pulsatile mass.     Tenderness: There is abdominal tenderness in the epigastric area. There is no right CVA tenderness, left CVA tenderness, guarding or rebound.  Musculoskeletal:        General: Normal range of motion.     Cervical back: Normal range of motion.  Skin:    General: Skin is warm.     Coloration: Skin is pale.  Neurological:     Mental Status: He is alert.  Psychiatric:        Mood and Affect: Mood normal.        Behavior: Behavior normal.      UC Treatments / Results  Labs (all labs ordered are listed, but only abnormal results are displayed) Labs Reviewed - No data to display  EKG   Radiology No results found.  Procedures Procedures (including critical care time)  Medications Ordered in UC Medications  ondansetron (ZOFRAN) injection 4 mg (4 mg Intramuscular Given 01/27/20 1912)  0.9 %  sodium chloride infusion ( Intravenous Stopped 01/27/20 2030)  HYDROmorphone (DILAUDID) injection 0.5 mg (0.5 mg Intravenous Given 01/27/20 1934)  alum & mag hydroxide-simeth (MAALOX/MYLANTA) 200-200-20 MG/5ML  suspension 30 mL (30 mLs Oral Given 01/27/20 2020)  And  lidocaine (XYLOCAINE) 2 % viscous mouth solution 15 mL (15 mLs Oral Given 01/27/20 2020)    Initial Impression / Assessment and Plan / UC Course  I have reviewed the triage vital signs and the nursing notes.  Pertinent labs & imaging results that were available during my care of the patient were reviewed by me and considered in my medical decision making (see chart for details).    After the Zofran injection, the patient stopped vomiting and was slightly more comfortable but still in significant abdominal pain.  I started an IV to give him a bolus of normal saline.  This also made him feel marginally better.  He complained of unremitting pain so was given 0.5 mg of Dilaudid.  With this he was able to sleep through most of his IV infusion.  When he woke up, however, the pain had come back.  I tried a GI cocktail which did not help. As the patient's abdominal pain is ramping up again, I recommend that he go to the emergency room. I feel he is safe to be transported private vehicle with his wife  Final Clinical Impressions(s) / UC Diagnoses   Final diagnoses:  Acute abdominal pain     Discharge Instructions     I recommend you go to the ER    ED Prescriptions    None     PDMP not reviewed this encounter.   Raylene Everts, MD 01/27/20 2041

## 2020-01-28 ENCOUNTER — Encounter (HOSPITAL_BASED_OUTPATIENT_CLINIC_OR_DEPARTMENT_OTHER): Payer: Self-pay

## 2020-01-28 ENCOUNTER — Emergency Department (HOSPITAL_BASED_OUTPATIENT_CLINIC_OR_DEPARTMENT_OTHER): Payer: Medicare Other

## 2020-01-28 ENCOUNTER — Inpatient Hospital Stay (HOSPITAL_COMMUNITY): Payer: Medicare Other

## 2020-01-28 DIAGNOSIS — Z79899 Other long term (current) drug therapy: Secondary | ICD-10-CM | POA: Diagnosis not present

## 2020-01-28 DIAGNOSIS — T8619 Other complication of kidney transplant: Secondary | ICD-10-CM | POA: Diagnosis not present

## 2020-01-28 DIAGNOSIS — I120 Hypertensive chronic kidney disease with stage 5 chronic kidney disease or end stage renal disease: Secondary | ICD-10-CM | POA: Diagnosis present

## 2020-01-28 DIAGNOSIS — I10 Essential (primary) hypertension: Secondary | ICD-10-CM | POA: Diagnosis not present

## 2020-01-28 DIAGNOSIS — Z96643 Presence of artificial hip joint, bilateral: Secondary | ICD-10-CM | POA: Diagnosis present

## 2020-01-28 DIAGNOSIS — D84821 Immunodeficiency due to drugs: Secondary | ICD-10-CM | POA: Diagnosis present

## 2020-01-28 DIAGNOSIS — Z888 Allergy status to other drugs, medicaments and biological substances status: Secondary | ICD-10-CM | POA: Diagnosis not present

## 2020-01-28 DIAGNOSIS — Z940 Kidney transplant status: Secondary | ICD-10-CM

## 2020-01-28 DIAGNOSIS — E871 Hypo-osmolality and hyponatremia: Secondary | ICD-10-CM | POA: Diagnosis present

## 2020-01-28 DIAGNOSIS — K810 Acute cholecystitis: Secondary | ICD-10-CM

## 2020-01-28 DIAGNOSIS — Z85810 Personal history of malignant neoplasm of tongue: Secondary | ICD-10-CM | POA: Diagnosis not present

## 2020-01-28 DIAGNOSIS — Z20822 Contact with and (suspected) exposure to covid-19: Secondary | ICD-10-CM | POA: Diagnosis present

## 2020-01-28 DIAGNOSIS — Z7952 Long term (current) use of systemic steroids: Secondary | ICD-10-CM

## 2020-01-28 DIAGNOSIS — Y830 Surgical operation with transplant of whole organ as the cause of abnormal reaction of the patient, or of later complication, without mention of misadventure at the time of the procedure: Secondary | ICD-10-CM | POA: Diagnosis not present

## 2020-01-28 DIAGNOSIS — D649 Anemia, unspecified: Secondary | ICD-10-CM | POA: Diagnosis present

## 2020-01-28 DIAGNOSIS — N179 Acute kidney failure, unspecified: Secondary | ICD-10-CM | POA: Diagnosis not present

## 2020-01-28 DIAGNOSIS — Z881 Allergy status to other antibiotic agents status: Secondary | ICD-10-CM | POA: Diagnosis not present

## 2020-01-28 DIAGNOSIS — Z923 Personal history of irradiation: Secondary | ICD-10-CM | POA: Diagnosis not present

## 2020-01-28 DIAGNOSIS — E860 Dehydration: Secondary | ICD-10-CM | POA: Diagnosis present

## 2020-01-28 DIAGNOSIS — E861 Hypovolemia: Secondary | ICD-10-CM | POA: Diagnosis present

## 2020-01-28 DIAGNOSIS — Z91041 Radiographic dye allergy status: Secondary | ICD-10-CM | POA: Diagnosis not present

## 2020-01-28 DIAGNOSIS — Z79890 Hormone replacement therapy (postmenopausal): Secondary | ICD-10-CM | POA: Diagnosis not present

## 2020-01-28 DIAGNOSIS — Z87891 Personal history of nicotine dependence: Secondary | ICD-10-CM | POA: Diagnosis not present

## 2020-01-28 DIAGNOSIS — E039 Hypothyroidism, unspecified: Secondary | ICD-10-CM | POA: Diagnosis present

## 2020-01-28 DIAGNOSIS — Z8349 Family history of other endocrine, nutritional and metabolic diseases: Secondary | ICD-10-CM | POA: Diagnosis not present

## 2020-01-28 DIAGNOSIS — K8000 Calculus of gallbladder with acute cholecystitis without obstruction: Secondary | ICD-10-CM | POA: Diagnosis present

## 2020-01-28 DIAGNOSIS — K82A1 Gangrene of gallbladder in cholecystitis: Secondary | ICD-10-CM | POA: Diagnosis present

## 2020-01-28 LAB — COMPREHENSIVE METABOLIC PANEL
ALT: 31 U/L (ref 0–44)
AST: 37 U/L (ref 15–41)
Albumin: 3 g/dL — ABNORMAL LOW (ref 3.5–5.0)
Alkaline Phosphatase: 49 U/L (ref 38–126)
Anion gap: 11 (ref 5–15)
BUN: 24 mg/dL — ABNORMAL HIGH (ref 6–20)
CO2: 24 mmol/L (ref 22–32)
Calcium: 8.6 mg/dL — ABNORMAL LOW (ref 8.9–10.3)
Chloride: 98 mmol/L (ref 98–111)
Creatinine, Ser: 1.46 mg/dL — ABNORMAL HIGH (ref 0.61–1.24)
GFR calc Af Amer: 60 mL/min — ABNORMAL LOW (ref >60)
GFR calc non Af Amer: 52 mL/min — ABNORMAL LOW (ref >60)
Glucose, Bld: 77 mg/dL (ref 70–99)
Potassium: 4.8 mmol/L (ref 3.5–5.1)
Sodium: 133 mmol/L — ABNORMAL LOW (ref 135–145)
Total Bilirubin: 1.7 mg/dL — ABNORMAL HIGH (ref 0.3–1.2)
Total Protein: 6.1 g/dL — ABNORMAL LOW (ref 6.5–8.1)

## 2020-01-28 LAB — ABO/RH: ABO/RH(D): B POS

## 2020-01-28 LAB — SARS CORONAVIRUS 2 BY RT PCR (HOSPITAL ORDER, PERFORMED IN ~~LOC~~ HOSPITAL LAB): SARS Coronavirus 2: NEGATIVE

## 2020-01-28 LAB — TYPE AND SCREEN
ABO/RH(D): B POS
Antibody Screen: NEGATIVE

## 2020-01-28 LAB — HIV ANTIBODY (ROUTINE TESTING W REFLEX): HIV Screen 4th Generation wRfx: NONREACTIVE

## 2020-01-28 LAB — MAGNESIUM: Magnesium: 1.2 mg/dL — ABNORMAL LOW (ref 1.7–2.4)

## 2020-01-28 MED ORDER — CYCLOSPORINE 25 MG PO CAPS
125.0000 mg | ORAL_CAPSULE | Freq: Every day | ORAL | Status: DC
  Administered 2020-01-28 – 2020-01-29 (×2): 125 mg via ORAL
  Filled 2020-01-28 (×3): qty 1

## 2020-01-28 MED ORDER — SODIUM CHLORIDE 0.9 % IV BOLUS
1000.0000 mL | Freq: Once | INTRAVENOUS | Status: AC
  Administered 2020-01-28: 1000 mL via INTRAVENOUS

## 2020-01-28 MED ORDER — LEVOTHYROXINE SODIUM 50 MCG PO TABS
50.0000 ug | ORAL_TABLET | Freq: Every day | ORAL | Status: DC
  Administered 2020-01-29 – 2020-01-30 (×2): 50 ug via ORAL
  Filled 2020-01-28 (×2): qty 1

## 2020-01-28 MED ORDER — ALBUTEROL SULFATE (2.5 MG/3ML) 0.083% IN NEBU: 2.5000 mg | INHALATION_SOLUTION | Freq: Four times a day (QID) | RESPIRATORY_TRACT | Status: DC | PRN

## 2020-01-28 MED ORDER — FENTANYL CITRATE (PF) 100 MCG/2ML IJ SOLN
50.0000 ug | Freq: Once | INTRAMUSCULAR | Status: AC
  Administered 2020-01-28: 50 ug via INTRAVENOUS
  Filled 2020-01-28: qty 2

## 2020-01-28 MED ORDER — PIPERACILLIN-TAZOBACTAM 3.375 G IVPB 30 MIN
3.3750 g | Freq: Three times a day (TID) | INTRAVENOUS | Status: DC
  Filled 2020-01-28: qty 50

## 2020-01-28 MED ORDER — SODIUM CHLORIDE 0.9% FLUSH: 3.0000 mL | Freq: Two times a day (BID) | INTRAVENOUS | Status: DC

## 2020-01-28 MED ORDER — IOHEXOL 350 MG/ML SOLN
100.0000 mL | Freq: Once | INTRAVENOUS | Status: AC | PRN
  Administered 2020-01-28: 100 mL via INTRAVENOUS

## 2020-01-28 MED ORDER — FENTANYL CITRATE (PF) 100 MCG/2ML IJ SOLN: 25.0000 ug | INTRAMUSCULAR | Status: DC | PRN

## 2020-01-28 MED ORDER — PANTOPRAZOLE SODIUM 40 MG IV SOLR
40.0000 mg | Freq: Two times a day (BID) | INTRAVENOUS | Status: DC
  Administered 2020-01-28 – 2020-01-29 (×4): 40 mg via INTRAVENOUS
  Filled 2020-01-28 (×5): qty 40

## 2020-01-28 MED ORDER — SODIUM CHLORIDE 0.9 % IV SOLN: INTRAVENOUS | Status: DC

## 2020-01-28 MED ORDER — FENTANYL CITRATE (PF) 100 MCG/2ML IJ SOLN
50.0000 ug | INTRAMUSCULAR | Status: DC | PRN
  Administered 2020-01-28: 50 ug via INTRAVENOUS
  Filled 2020-01-28: qty 2

## 2020-01-28 MED ORDER — PREDNISONE 5 MG PO TABS
5.0000 mg | ORAL_TABLET | Freq: Every day | ORAL | Status: DC
  Administered 2020-01-28 – 2020-01-30 (×3): 5 mg via ORAL
  Filled 2020-01-28 (×3): qty 1

## 2020-01-28 MED ORDER — FENTANYL CITRATE (PF) 100 MCG/2ML IJ SOLN
100.0000 ug | INTRAMUSCULAR | Status: DC | PRN
  Administered 2020-01-28: 100 ug via INTRAVENOUS
  Filled 2020-01-28: qty 2

## 2020-01-28 MED ORDER — DIPHENHYDRAMINE HCL 50 MG/ML IJ SOLN
50.0000 mg | Freq: Once | INTRAMUSCULAR | Status: AC
  Administered 2020-01-28: 50 mg via INTRAVENOUS
  Filled 2020-01-28: qty 1

## 2020-01-28 MED ORDER — PANTOPRAZOLE SODIUM 40 MG IV SOLR
40.0000 mg | Freq: Once | INTRAVENOUS | Status: AC
  Administered 2020-01-28: 40 mg via INTRAVENOUS
  Filled 2020-01-28: qty 40

## 2020-01-28 MED ORDER — ONDANSETRON HCL 4 MG/2ML IJ SOLN: 4.0000 mg | Freq: Four times a day (QID) | INTRAMUSCULAR | Status: DC | PRN

## 2020-01-28 MED ORDER — PIPERACILLIN-TAZOBACTAM 3.375 G IVPB 30 MIN
3.3750 g | Freq: Once | INTRAVENOUS | Status: AC
  Administered 2020-01-28: 3.375 g via INTRAVENOUS
  Filled 2020-01-28 (×2): qty 50

## 2020-01-28 MED ORDER — CYCLOSPORINE 25 MG PO CAPS
150.0000 mg | ORAL_CAPSULE | Freq: Every day | ORAL | Status: DC
  Administered 2020-01-29 – 2020-01-30 (×2): 150 mg via ORAL
  Filled 2020-01-28 (×2): qty 2

## 2020-01-28 MED ORDER — SODIUM CHLORIDE 0.9 % IV BOLUS
500.0000 mL | Freq: Once | INTRAVENOUS | Status: AC
  Administered 2020-01-28: 500 mL via INTRAVENOUS

## 2020-01-28 MED ORDER — PIPERACILLIN-TAZOBACTAM 3.375 G IVPB
3.3750 g | Freq: Three times a day (TID) | INTRAVENOUS | Status: DC
  Administered 2020-01-28 – 2020-01-30 (×6): 3.375 g via INTRAVENOUS
  Filled 2020-01-28 (×6): qty 50

## 2020-01-28 MED ORDER — SODIUM CHLORIDE 0.9 % IV SOLN
INTRAVENOUS | Status: DC | PRN
  Administered 2020-01-28: 250 mL via INTRAVENOUS

## 2020-01-28 MED ORDER — FENTANYL CITRATE (PF) 100 MCG/2ML IJ SOLN: 50.0000 ug | INTRAMUSCULAR | Status: DC | PRN

## 2020-01-28 MED ORDER — ENOXAPARIN SODIUM 40 MG/0.4ML ~~LOC~~ SOLN
40.0000 mg | SUBCUTANEOUS | Status: DC
  Administered 2020-01-28: 40 mg via SUBCUTANEOUS
  Filled 2020-01-28: qty 0.4

## 2020-01-28 NOTE — ED Provider Notes (Addendum)
[facility] Provider Note: [name], MD, FACEP  CSN: [id number] MRN: [id number] ARRIVAL: 01/27/20 at 2129 ROOM: [place]  Abdominal Pain   HISTORY OF PRESENT ILLNESS  01/28/20 12:57 AM [name] is a 59 y.o. male who is status post kidney transplant on immunosuppressive therapy.  He is here with epigastric pain, nausea and vomiting that began yesterday after eating lunch.  He has had no fever or chills.  He rates the pain is a 10 out of 10.  It has both sharp and crampy components.  It is not worse with movement or palpation.  He denies diarrhea.  He was given fentanyl prior to my evaluation with minimal relief.   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 Conley Canal - [facility]   perm cath placed and removed     SKIN GRAFT  05/23/14   split thickness skin graft neck poss   submental flap  05/23/14   TOTAL HIP ARTHROPLASTY Bilateral    TRACHEOSTOMY  05/23/14    Family History  Problem Relation Age of Onset   Thyroid disease Mother     Social History   Tobacco Use   Smoking status: Never Smoker   Smokeless tobacco: Former Systems developer    Types: Chew  Substance Use Topics   Alcohol use: Yes    Alcohol/week: 6.0 standard drinks    Types: 6 Glasses of wine per week   Drug use: No    Prior to Admission medications   Medication Sig Start Date End Date Taking? Authorizing Provider  amoxicillin (AMOXIL) 500 MG capsule Take 500 mg by  mouth as needed.    [provider]  cycloSPORINE (SANDIMMUNE) 100 MG capsule Take 100 mg by mouth 2 (two) times daily. 125 mg in morning and  100 mg at night    [provider]  cycloSPORINE modified (NEORAL) 25 MG capsule  03/04/17   [provider]  levothyroxine (SYNTHROID) 50 MCG tablet Take 1 tablet (50 mcg total) by mouth daily. 11/09/19 02/07/20  Horald Pollen, MD  losartan (COZAAR) 50 MG tablet  01/17/17   [provider]  predniSONE (DELTASONE) 5 MG tablet Take 5 mg by mouth.    [provider]    Allergies Diflucan [fluconazole], Contrast media [iodinated diagnostic agents], and Vancomycin   REVIEW OF SYSTEMS  Negative except as noted here or in the History of Present Illness.   PHYSICAL EXAMINATION  Initial Vital Signs Blood pressure (!) 153/94, pulse 86, temperature 98.6 F (37 C), temperature source Oral, resp. rate 20, height 6' (1.829 m), weight 77.1 kg, SpO2 100 %.  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; bowel sounds present Extremities: No deformity; full range of motion; pulses normal Neurologic: Awake, alert and oriented; motor function intact in all extremities and symmetric; no facial droop Skin: Warm and dry Psychiatric: Flat affect   RESULTS  Summary of this visit's results, reviewed and interpreted by myself:   EKG Interpretation  Date/Time:  Saturday January 28 2020 00:54:12 EDT Ventricular Rate:  86 PR Interval:    QRS Duration: 86 QT Interval:  342 QTC Calculation: 409 R Axis:   49 Text Interpretation: Sinus rhythm Probable anteroseptal infarct,  old Rate is faster Confirmed by Shanon Rosser [phone]) on 01/28/2020 12:59:29 AM      Laboratory Studies: Results for orders placed or performed during the hospital encounter of 01/27/20 (from the past 24 hour(s))  CBC with Differential     Status: Abnormal   Collection Time: 01/27/20 10:14 PM  Result Value Ref Range   WBC 10.0 4.0 - 10.5 K/uL   RBC 3.76 (L) 4.22 - 5.81 MIL/uL   Hemoglobin 12.0 (L) 13.0 - 17.0 g/dL   HCT 35.9 (L) 39 - 52 %   MCV 95.5 80.0 - 100.0 fL   MCH 31.9 26.0 - 34.0 pg   MCHC 33.4 30.0 - 36.0 g/dL   RDW 12.8 11.5 - 15.5 %   Platelets 205 150 - 400 K/uL   nRBC 0.0 0.0 - 0.2 %   Neutrophils Relative % 85 %   Neutro Abs 8.4 (H) 1.7 - 7.7 K/uL   Lymphocytes Relative 6 %   Lymphs Abs 0.6 (L) 0.7 - 4.0 K/uL   Monocytes Relative 9 %   Monocytes Absolute 0.9 0 - 1 K/uL   Eosinophils Relative 0 %   Eosinophils Absolute 0.0 0 - 0 K/uL   Basophils Relative 0 %   Basophils Absolute 0.0 0 - 0 K/uL   Immature Granulocytes 0 %   Abs Immature Granulocytes 0.04 0.00 - 0.07 K/uL  Comprehensive metabolic panel     Status: Abnormal   Collection Time: 01/27/20 10:14 PM  Result Value Ref Range   Sodium 132 (L) 135 - 145 mmol/L   Potassium 3.8 3.5 - 5.1 mmol/L   Chloride 97 (L) 98 - 111 mmol/L   CO2 24 22 - 32 mmol/L   Glucose, Bld 137 (H) 70 - 99 mg/dL   BUN 24 (H) 6 - 20 mg/dL   Creatinine, Ser 1.01 0.61 - 1.24 mg/dL   Calcium 8.9 8.9 - 10.3 mg/dL   Total Protein 6.8 6.5 - 8.1 g/dL   Albumin 3.7 3.5 - 5.0 g/dL   AST 27 15 - 41 U/L   ALT 28 0 - 44 U/L   Alkaline Phosphatase 56 38 - 126 U/L   Total Bilirubin 0.9 0.3 - 1.2 mg/dL   GFR calc non Af Amer >60 >60 mL/min   GFR calc Af Amer >60 >60 mL/min   Anion gap 11 5 - 15  Lipase, blood     Status: None   Collection Time: 01/27/20 10:14 PM  Result Value Ref Range   Lipase 44 11 - 51 U/L  Urinalysis, Routine w reflex microscopic     Status: Abnormal   Collection Time: 01/27/20 10:51 PM  Result Value Ref Range    Color, Urine YELLOW YELLOW   APPearance CLEAR CLEAR   Specific Gravity, Urine 1.020 1.005 - 1.030   pH 7.5 5.0 - 8.0   Glucose, UA NEGATIVE NEGATIVE mg/dL   Hgb urine dipstick  SMALL (A) NEGATIVE   Bilirubin Urine NEGATIVE NEGATIVE   Ketones, ur NEGATIVE NEGATIVE mg/dL   Protein, ur NEGATIVE NEGATIVE mg/dL   Nitrite NEGATIVE NEGATIVE   Leukocytes,Ua NEGATIVE NEGATIVE  Urinalysis, Microscopic (reflex)     Status: Abnormal   Collection Time: 01/27/20 10:51 PM  Result Value Ref Range   RBC / HPF 0-5 0 - 5 RBC/hpf   WBC, UA 0-5 0 - 5 WBC/hpf   Bacteria, UA FEW (A) NONE SEEN   Squamous Epithelial / LPF 0-5 0 - 5  SARS Coronavirus 2 by RT PCR (hospital order, performed in [place] hospital lab) Nasopharyngeal Nasopharyngeal Swab     Status: None   Collection Time: 01/28/20  3:55 AM   Specimen: Nasopharyngeal Swab  Result Value Ref Range   SARS Coronavirus 2 NEGATIVE NEGATIVE   Imaging Studies: CT Angio Abd/Pel W and/or Wo Contrast  Result Date: 01/28/2020 CLINICAL DATA:  Epigastric pain, vomiting, history of renal transplant EXAM: CTA ABDOMEN AND PELVIS WITHOUT AND WITH CONTRAST TECHNIQUE: Multidetector CT imaging of the abdomen and pelvis was performed using the standard protocol during bolus administration of intravenous contrast. Multiplanar reconstructed images and MIPs were obtained and reviewed to evaluate the vascular anatomy. CONTRAST:  138mL OMNIPAQUE IOHEXOL 350 MG/ML SOLN COMPARISON:  04/30/2015 FINDINGS: VASCULAR Aorta: Normal caliber aorta without aneurysm, dissection, vasculitis or significant stenosis. Celiac: Patent without evidence of aneurysm, dissection, vasculitis or significant stenosis. SMA: Patent without evidence of aneurysm, dissection, vasculitis or significant stenosis. Renals: Native renal arteries are markedly atretic, consistent with end-stage renal disease. Transplant renal artery off the right external iliac artery is widely patent. IMA: Patent without evidence  of aneurysm, dissection, vasculitis or significant stenosis. Inflow: Patent without evidence of aneurysm, dissection, vasculitis or significant stenosis. Proximal Outflow: Bilateral common femoral and visualized portions of the superficial and profunda femoral arteries are patent without evidence of aneurysm, dissection, vasculitis or significant stenosis. Veins: No obvious venous abnormality within the limitations of this arterial phase study. Review of the MIP images confirms the above findings. NON-VASCULAR Lower chest: No acute pleural or parenchymal lung disease. Hepatobiliary: Gallbladder is markedly distended with gallbladder wall thickening, intramural edema, and significant pericholecystic fat stranding consistent with acute cholecystitis. No calcified gallstones. There are no focal liver abnormalities. Pancreas: Unremarkable. No pancreatic ductal dilatation or surrounding inflammatory changes. Spleen: Normal in size without focal abnormality. Adrenals/Urinary Tract: The native kidneys are markedly atrophic with cortical calcifications consistent with end-stage renal disease. Transplant kidney in the right lower quadrant enhances normally, with no focal abnormality. Bladder is moderately distended with no gross abnormalities. Evaluation is limited due to streak artifact from bilateral hip arthroplasties. Stomach/Bowel: No bowel obstruction or ileus. No wall thickening or inflammatory change. Lymphatic: No pathologic adenopathy. Reproductive: Prostate is unremarkable. Other: Trace free fluid in the right upper quadrant associated with acute cholecystitis. No free intraperitoneal gas. Musculoskeletal: Bilateral hip arthroplasties. No acute or destructive bony lesions. IMPRESSION: 1. Acute cholecystitis, with marked gallbladder wall thickening, pericholecystic fluid, and gallbladder distension. No calcified gallstones. 2. Mild atherosclerosis throughout the aorta, with no significant vascular abnormality. 3.  Unremarkable right lower quadrant renal transplant. Electronically Signed   By: Randa Ngo M.D.   On: 01/28/2020 02:44    ED COURSE and MDM  Nursing notes, initial and subsequent vitals signs, including pulse oximetry, reviewed and interpreted by myself.  Vitals:   01/27/20 2243 01/28/20 0013 01/28/20 0331 01/28/20 0354  BP: 111/71 (!) 153/94 (!) 161/89 (!) 161/89  Pulse: 93 86  88 91  Resp:  20 15 18   Temp:    99.5 F (37.5 C)  TempSrc:      SpO2: 99% 100% 99% 100%  Weight:      Height:       Medications  0.9 %  sodium chloride infusion (250 mLs Intravenous New Bag/Given 01/28/20 0352)  fentaNYL (SUBLIMAZE) injection 100 mcg (has no administration in time range)  sodium chloride 0.9 % bolus 1,000 mL ( Intravenous Stopped 01/28/20 0224)  fentaNYL (SUBLIMAZE) injection 50 mcg (50 mcg Intravenous Given 01/28/20 0119)  pantoprazole (PROTONIX) injection 40 mg (40 mg Intravenous Given 01/28/20 0122)  iohexol (OMNIPAQUE) 350 MG/ML injection 100 mL (100 mLs Intravenous Contrast Given 01/28/20 0210)  diphenhydrAMINE (BENADRYL) injection 50 mg (50 mg Intravenous Given 01/28/20 0130)  piperacillin-tazobactam (ZOSYN) IVPB 3.375 g (3.375 g Intravenous New Bag/Given 01/28/20 0353)   1:12 AM Patient's pain is out of proportion to his physical examination or lab findings.  This raises the suspicion of mesenteric ischemia.  This was discussed with Dr. Owens Shark of radiology and he believes a CT angiogram of the abdomen with IV contrast is indicated.  The patient is 20 years status post renal transplant with excellent kidney function.  3:08 AM CT shows acute cholecystitis although patient still nontender in the right upper quadrant.  Zosyn ordered.  Will consult general surgery.  4:02 AM Dr. Marlou Starks of general surgery recommends the patient be transferred to [facility] as his renal transplant status potentially complicates his care.  4:27 AM Gust with Dr. Earleen Newport of general surgery at [facility].  She  states there is a several day waiting list for beds at their facility. She does not believe that this patient needs the care of the transplant team given that his transplant has been stable for over 20 years.  4:34 AM Dr. Romana Juniper of general surgery will see patient in consultation at [facility] but recommends we admit to the hospitalist service.  4:53 AM Discussed with hospitalist on-call.  Patient will be admitted to the [facility] hospitalist service.   PROCEDURES  Procedures CRITICAL CARE Performed by: Karen Chafe Brinleigh Tew Total critical care time: 45 minutes Critical care time was exclusive of separately billable procedures and treating other patients. Critical care was necessary to treat or prevent imminent or life-threatening deterioration. Critical care was time spent personally by me on the following activities: development of treatment plan with patient and/or surrogate as well as nursing, discussions with consultants, evaluation of patient's response to treatment, examination of patient, obtaining history from patient or surrogate, ordering and performing treatments and interventions, ordering and review of laboratory studies, ordering and review of radiographic studies, pulse oximetry and re-evaluation of patient's condition.   ED DIAGNOSES     ICD-10-CM   1. Acute cholecystitis without calculus  K81.0   2. Kidney transplant status  Z94.0        Jeffery Thebeau, MD 01/28/20 0454    Shanon Rosser, MD 01/28/20 [phone]

## 2020-01-28 NOTE — Consult Note (Signed)
Reason for Consult:cholecystitis Referring Physician: Dr. Fuller Plan  [name] is an 59 y.o. male.  HPI: This is a 59 year old gentleman transferred from [place].  He had the sudden onset of epigastric and right upper quadrant abdominal pain after eating lunch yesterday.  He developed nausea and vomiting.  He described sharp pain which was 10 out of 10.  He ended up having a CT angiogram of his abdomen and pelvis which showed a distended gallbladder with some pericholecystic inflammation.  There were no stones seen.  There were no other abnormalities.  He has no previous history of biliary colic.  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  Currently, his abdominal pain has improved somewhat.  Bowel movements have been normal.  Past Medical History:  Diagnosis Date  . 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 HTN (hypertension)   . Hypertension    Phreesia 01/05/2020  . Renal disease   . Status post radiation therapy 07/17/14-08/28/14   Tongue  . 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 HIP SURGERY     bone transplant per pt to both hips  . JOINT REPLACEMENT N/A    Phreesia 01/05/2020  . KIDNEY TRANSPLANT  11/1998  . NECK DISSECTION Bilateral 05/23/14  . PARTIAL GLOSSECTOMY Bilateral 05/23/14   Dr. Conley Canal [facility]  . perm cath placed and removed    . SKIN GRAFT  05/23/14   split thickness skin graft neck poss  . submental flap  05/23/14  . TOTAL HIP ARTHROPLASTY Bilateral   . TRACHEOSTOMY  05/23/14    Family History  Problem Relation Age of Onset  . Thyroid disease Mother     Social History:  reports that he has never smoked. He quit smokeless tobacco use about 5 years ago.  His smokeless tobacco use included chew. He reports current alcohol use of about 6.0 standard drinks of alcohol per week. He reports that he does not use drugs.  Allergies:  Allergies   Allergen Reactions  . Diflucan [Fluconazole] Other (See Comments)    Incompatible with cyclosporine  . Contrast Media [Iodinated Diagnostic Agents] Rash    Pt tolerates benadryl pre scan without post scan complications   . Vancomycin Rash    Medications: I have reviewed the patient's current medications.  Results for orders placed or performed during the hospital encounter of 01/27/20 (from the past 48 hour(s))  CBC with Differential     Status: Abnormal   Collection Time: 01/27/20 10:14 PM  Result Value Ref Range   WBC 10.0 4.0 - 10.5 K/uL   RBC 3.76 (L) 4.22 - 5.81 MIL/uL   Hemoglobin 12.0 (L) 13.0 - 17.0 g/dL   HCT 35.9 (L) 39 - 52 %   MCV 95.5 80.0 - 100.0 fL   MCH 31.9 26.0 - 34.0 pg   MCHC 33.4 30.0 - 36.0 g/dL   RDW 12.8 11.5 - 15.5 %   Platelets 205 150 - 400 K/uL   nRBC 0.0 0.0 - 0.2 %   Neutrophils Relative % 85 %   Neutro Abs 8.4 (H) 1.7 - 7.7 K/uL   Lymphocytes Relative 6 %   Lymphs Abs 0.6 (L) 0.7 - 4.0 K/uL   Monocytes Relative 9 %   Monocytes Absolute 0.9 0 - 1 K/uL   Eosinophils Relative 0 %   Eosinophils Absolute 0.0 0 -  0 K/uL   Basophils Relative 0 %   Basophils Absolute 0.0 0 - 0 K/uL   Immature Granulocytes 0 %   Abs Immature Granulocytes 0.04 0.00 - 0.07 K/uL    Comment: Performed at [facility], [address] [place], [place] [address]  Comprehensive metabolic panel     Status: Abnormal   Collection Time: 01/27/20 10:14 PM  Result Value Ref Range   Sodium 132 (L) 135 - 145 mmol/L   Potassium 3.8 3.5 - 5.1 mmol/L   Chloride 97 (L) 98 - 111 mmol/L   CO2 24 22 - 32 mmol/L   Glucose, Bld 137 (H) 70 - 99 mg/dL    Comment: Glucose reference range applies only to samples taken after fasting for at least 8 hours.   BUN 24 (H) 6 - 20 mg/dL   Creatinine, Ser 1.01 0.61 - 1.24 mg/dL   Calcium 8.9 8.9 - 10.3 mg/dL   Total Protein 6.8 6.5 - 8.1 g/dL   Albumin 3.7 3.5 - 5.0 g/dL   AST 27 15 - 41 U/L   ALT 28 0 - 44 U/L   Alkaline  Phosphatase 56 38 - 126 U/L   Total Bilirubin 0.9 0.3 - 1.2 mg/dL   GFR calc non Af Amer >60 >60 mL/min   GFR calc Af Amer >60 >60 mL/min   Anion gap 11 5 - 15    Comment: Performed at [facility], [address] [place], [place] [address]  Lipase, blood     Status: None   Collection Time: 01/27/20 10:14 PM  Result Value Ref Range   Lipase 44 11 - 51 U/L    Comment: Performed at [facility], [address] [place], [place] [address]  Urinalysis, Routine w reflex microscopic     Status: Abnormal   Collection Time: 01/27/20 10:51 PM  Result Value Ref Range   Color, Urine YELLOW YELLOW   APPearance CLEAR CLEAR   Specific Gravity, Urine 1.020 1.005 - 1.030   pH 7.5 5.0 - 8.0   Glucose, UA NEGATIVE NEGATIVE mg/dL   Hgb urine dipstick SMALL (A) NEGATIVE   Bilirubin Urine NEGATIVE NEGATIVE   Ketones, ur NEGATIVE NEGATIVE mg/dL   Protein, ur NEGATIVE NEGATIVE mg/dL   Nitrite NEGATIVE NEGATIVE   Leukocytes,Ua NEGATIVE NEGATIVE    Comment: Performed at [facility], [address] [place], [place] [address]  Urinalysis, Microscopic (reflex)     Status: Abnormal   Collection Time: 01/27/20 10:51 PM  Result Value Ref Range   RBC / HPF 0-5 0 - 5 RBC/hpf   WBC, UA 0-5 0 - 5 WBC/hpf   Bacteria, UA FEW (A) NONE SEEN   Squamous Epithelial / LPF 0-5 0 - 5    Comment: Performed at [facility], [address] [place], [place] [address]  SARS Coronavirus 2 by RT PCR (hospital order, performed in [place] hospital lab) Nasopharyngeal Nasopharyngeal Swab     Status: None   Collection Time: 01/28/20  3:55 AM   Specimen: Nasopharyngeal Swab  Result Value Ref Range   SARS Coronavirus 2 NEGATIVE NEGATIVE    Comment: (NOTE) SARS-CoV-2 target nucleic acids are NOT DETECTED.  The SARS-CoV-2 RNA is generally detectable in upper and lower respiratory specimens during the acute phase of infection. The lowest concentration of SARS-CoV-2 viral copies  this assay can detect is 250 copies / mL. A negative result does not preclude  SARS-CoV-2 infection and should not be used as the sole basis for treatment or other patient management decisions.  A negative result may occur with improper specimen collection / handling, submission of specimen other than nasopharyngeal swab, presence of viral mutation(s) within the areas targeted by this assay, and inadequate number of viral copies (<250 copies / mL). A negative result must be combined with clinical observations, patient history, and epidemiological information.  Fact Sheet for Patients:   StrictlyIdeas.no  Fact Sheet for Healthcare Providers: BankingDealers.co.za  This test is not yet approved or  cleared by the [place] FDA and has been authorized for detection and/or diagnosis of SARS-CoV-2 by FDA under an Emergency Use Authorization (EUA).  This EUA will remain in effect (meaning this test can be used) for the duration of the COVID-19 declaration under Section 564(b)(1) of the Act, 21 U.S.C. section 360bbb-3(b)(1), unless the authorization is terminated or revoked sooner.  Performed at [facility], [address] [place], [place] [address]     CT Angio Abd/Pel W and/or Wo Contrast  Result Date: 01/28/2020 CLINICAL DATA:  Epigastric pain, vomiting, history of renal transplant EXAM: CTA ABDOMEN AND PELVIS WITHOUT AND WITH CONTRAST TECHNIQUE: Multidetector CT imaging of the abdomen and pelvis was performed using the standard protocol during bolus administration of intravenous contrast. Multiplanar reconstructed images and MIPs were obtained and reviewed to evaluate the vascular anatomy. CONTRAST:  111mL OMNIPAQUE IOHEXOL 350 MG/ML SOLN COMPARISON:  04/30/2015 FINDINGS: VASCULAR Aorta: Normal caliber aorta without aneurysm, dissection, vasculitis or significant stenosis. Celiac: Patent without evidence of aneurysm, dissection,  vasculitis or significant stenosis. SMA: Patent without evidence of aneurysm, dissection, vasculitis or significant stenosis. Renals: Native renal arteries are markedly atretic, consistent with end-stage renal disease. Transplant renal artery off the right external iliac artery is widely patent. IMA: Patent without evidence of aneurysm, dissection, vasculitis or significant stenosis. Inflow: Patent without evidence of aneurysm, dissection, vasculitis or significant stenosis. Proximal Outflow: Bilateral common femoral and visualized portions of the superficial and profunda femoral arteries are patent without evidence of aneurysm, dissection, vasculitis or significant stenosis. Veins: No obvious venous abnormality within the limitations of this arterial phase study. Review of the MIP images confirms the above findings. NON-VASCULAR Lower chest: No acute pleural or parenchymal lung disease. Hepatobiliary: Gallbladder is markedly distended with gallbladder wall thickening, intramural edema, and significant pericholecystic fat stranding consistent with acute cholecystitis. No calcified gallstones. There are no focal liver abnormalities. Pancreas: Unremarkable. No pancreatic ductal dilatation or surrounding inflammatory changes. Spleen: Normal in size without focal abnormality. Adrenals/Urinary Tract: The native kidneys are markedly atrophic with cortical calcifications consistent with end-stage renal disease. Transplant kidney in the right lower quadrant enhances normally, with no focal abnormality. Bladder is moderately distended with no gross abnormalities. Evaluation is limited due to streak artifact from bilateral hip arthroplasties. Stomach/Bowel: No bowel obstruction or ileus. No wall thickening or inflammatory change. Lymphatic: No pathologic adenopathy. Reproductive: Prostate is unremarkable. Other: Trace free fluid in the right upper quadrant associated with acute cholecystitis. No free intraperitoneal gas.  Musculoskeletal: Bilateral hip arthroplasties. No acute or destructive bony lesions. IMPRESSION: 1. Acute cholecystitis, with marked gallbladder wall thickening, pericholecystic fluid, and gallbladder distension. No calcified gallstones. 2. Mild atherosclerosis throughout the aorta, with no significant vascular abnormality. 3. Unremarkable right lower quadrant renal transplant. Electronically Signed   By: Randa Ngo M.D.   On: 01/28/2020 02:44    Review of Systems  Constitutional: Negative for chills, diaphoresis and fever.  Respiratory:  Negative for shortness of breath.   Cardiovascular: Negative for chest pain.  Gastrointestinal: Positive for abdominal pain, nausea and vomiting. Negative for constipation and diarrhea.  Genitourinary: Negative for difficulty urinating and dysuria.  All other systems reviewed and are negative.  Blood pressure 97/66, pulse 85, temperature 98.5 F (36.9 C), temperature source Oral, resp. rate 14, height 6' (1.829 m), weight 77.1 kg, SpO2 100 %. Physical Exam Constitutional:      General: He is not in acute distress.    Appearance: He is normal weight. He is not diaphoretic.  HENT:     Head: Normocephalic and atraumatic.  Eyes:     Extraocular Movements: Extraocular movements intact.     Pupils: Pupils are equal, round, and reactive to light.  Cardiovascular:     Rate and Rhythm: Normal rate and regular rhythm.     Heart sounds: Normal heart sounds.  Pulmonary:     Effort: Pulmonary effort is normal.     Breath sounds: Normal breath sounds.  Abdominal:     General: Abdomen is flat. There is no distension.     Palpations: Abdomen is soft.     Tenderness: There is abdominal tenderness in the right upper quadrant and epigastric area.     Hernia: No hernia is present.     Comments: There is mild tenderness with guarding in the right upper quadrant  Skin:    General: Skin is warm and dry.     Comments: He appears a little flushed  Neurological:      Mental Status: He is alert.     Assessment/Plan: Right upper quadrant abdominal pain and possible cholecystitis  I discussed the diagnosis with the patient and his wife.  We will get an ultrasound of his right upper quadrant to evaluate the gallbladder to see if there are stones present.  He may need a HIDA scan if we are not completely convinced this is cholecystitis.  If he does have cholecystitis, we would recommend a laparoscopic cholecystectomy.  I will order the ultrasound and then place him on clear liquids after the ultrasound is completed.  He would also benefit from a nephrology consult preoperatively.  Currently, his creatinine is normal.  I will bolus him with IV fluid as he did have a CT scan with contrast this morning.  He and his wife agree with the plans.  Coralie Keens 01/28/2020, 10:02 AM

## 2020-01-28 NOTE — Plan of Care (Signed)

## 2020-01-28 NOTE — Consult Note (Signed)
[name] Admit Date: 01/27/2020 01/28/2020 [name] Requesting Physician:  Fuller Plan  Reason for Consult:  Renal transplant status HPI:  59 year old male with past medical history of deceased donor kidney transplant in 2000, hypertension, hypothyroidism, history of tongue cancer (squamous cell carcinoma) who presents with acute abdominal pain. He initially thought it was indigestion which was not relieved with antacids. He also had some nausea and vomiting as well. He did CT scan of the abdomen pelvis which revealed acute cholecystitis along with marked gallbladder wall thickening and no signs of gallstones. Attempts were made to transfer to [facility] given that all his doctors are there along with the transplant team but the waiting list was too long therefore transferred here. He has not received his cyclosporine last night nor this morning. He denies any headaches, changes in vision, tremors, hematuria, pain over his allograft site, dysuria, diarrhea, chest pain, shortness of breath, swelling.  PMH Incudes: Past Medical History:  Diagnosis Date  . 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 HTN (hypertension)   . Hypertension    Phreesia 01/05/2020  . Renal disease   . Status post radiation therapy 07/17/14-08/28/14   Tongue  . Tongue cancer ([place])    right       Creatinine, Ser (mg/dL)  Date Value  01/27/2020 1.01  11/09/2019 1.05  01/07/2011 1.30  ] I/Os:  ROS Balance of 12 systems is negative w/ exceptions as above  PMH  Past Medical History:  Diagnosis Date  . 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 HTN (hypertension)   . Hypertension    Phreesia 01/05/2020  . Renal disease   . Status post radiation therapy 07/17/14-08/28/14   Tongue  . Tongue cancer ([place])    right   PSH  Past Surgical History:  Procedure Laterality Date  . HIP SURGERY     bone transplant per pt to both hips  . JOINT REPLACEMENT N/A     Phreesia 01/05/2020  . KIDNEY TRANSPLANT  11/1998  . NECK DISSECTION Bilateral 05/23/14  . PARTIAL GLOSSECTOMY Bilateral 05/23/14   Dr. Conley Canal [facility]  . perm cath placed and removed    . SKIN GRAFT  05/23/14   split thickness skin graft neck poss  . submental flap  05/23/14  . TOTAL HIP ARTHROPLASTY Bilateral   . TRACHEOSTOMY  05/23/14   FH  Family History  Problem Relation Age of Onset  . Thyroid disease Mother    SH  reports that he has never smoked. He quit smokeless tobacco use about 5 years ago.  His smokeless tobacco use included chew. He reports current alcohol use of about 6.0 standard drinks of alcohol per week. He reports that he does not use drugs. Allergies  Allergies  Allergen Reactions  . Diflucan [Fluconazole] Other (See Comments)    Incompatible with cyclosporine  . Contrast Media [Iodinated Diagnostic Agents] Rash    Pt tolerates benadryl pre scan without post scan complications   . Vancomycin Rash   Home medications Prior to Admission medications   Medication Sig Start Date End Date Taking? Authorizing Provider  cycloSPORINE (SANDIMMUNE) 100 MG capsule Take 125-150 mg by mouth See admin instructions. 150 mg in morning and  125  mg at night   Yes [provider]  levothyroxine (SYNTHROID) 50 MCG tablet Take 1 tablet (50 mcg total) by mouth daily. 11/09/19 02/07/20 Yes Sagardia, Ines Bloomer, MD  losartan ([place])  50 MG tablet  01/17/17  Yes [provider]  predniSONE (DELTASONE) 5 MG tablet Take 5 mg by mouth.   Yes [provider]    Current Medications Scheduled Meds: . cycloSPORINE  125 mg Oral QHS  . [START ON 01/29/2020] cycloSPORINE  150 mg Oral Daily  . enoxaparin (LOVENOX) injection  40 mg Subcutaneous Q24H  . [START ON 01/29/2020] levothyroxine  50 mcg Oral Q0600  . pantoprazole (PROTONIX) IV  40 mg Intravenous Q12H  . predniSONE  5 mg Oral Q breakfast  . sodium chloride flush  3 mL Intravenous Q12H   Continuous  Infusions: . sodium chloride Stopped (01/28/20 0516)  . sodium chloride 75 mL/hr at 01/28/20 0904  . piperacillin-tazobactam (ZOSYN)  IV 3.375 g (01/28/20 1218)   PRN Meds:.sodium chloride, albuterol, fentaNYL (SUBLIMAZE) injection, fentaNYL (SUBLIMAZE) injection, ondansetron (ZOFRAN) IV  CBC Recent Labs  Lab 01/27/20 2214  WBC 10.0  NEUTROABS 8.4*  HGB 12.0*  HCT 35.9*  MCV 95.5  PLT 767   Basic Metabolic Panel Recent Labs  Lab 01/27/20 2214  NA 132*  K 3.8  CL 97*  CO2 24  GLUCOSE 137*  BUN 24*  CREATININE 1.01  CALCIUM 8.9    Physical Exam  Blood pressure 111/69, pulse 87, temperature 97.6 F (36.4 C), temperature source Oral, resp. rate 14, height 6' (1.829 m), weight 77.1 kg, SpO2 100 %. GEN: sitting up in bed, nad ENT: no nasal discharge, mmm EYES: no scleral icterus, eomi CV: S1-S2, normal rate, no murmurs PULM: CTA bilaterally, no W/R/R/C, bilateral chest expansion, unlabored ABD: NABS, slightly distended, nontender, no tenderness on palpation over allograft site SKIN: no rashes or jaundice EXT: no edema, warm and well perfused Neuro: aaox3, speech clear and coherent, no focal deficits, no tremors   Assessment 1. 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 Good allograft function: Baseline Cr ~1. Renal function at baseline  -Transplant Nephrologist: [facility])  -Nephrologist: Dr. Elmarie Shiley ([place])  -home maintenance immunosuppression: Prednisone 5 mg daily and cyclosporine 150mg  qam and  125mg  qhs: goal CYA trough 100-150. No MPA due to cancer history (SCC of tongue) 2.  Acute cholecystitis, mgmt per surgery and primary 3. HTN 4. H/o squamous cell carcinoma of the tongue  Plan 1. Continue with home immunosuppression: cyclosprine 150/125mg  and prednisone 5mg  daily. Monitor cyclosporine trough, targeting goal trough of 100-150. Can check this on either Monday or Tuesday once he reaches steady state (has not received last night's and this morning's dose) 2. If  any change in renal function please obtain renal transplant ultrasound with duplex 3. Will hold off on check BK, CMV, and EBV (all negative 01/20/2020) 4. Agree with post contrast isotonic fluid hydration 5. Avoid nephrotoxic medications including NSAIDs and iodinated intravenous contrast exposure unless the latter is absolutely indicated.  Preferred narcotic agents for pain control are hydromorphone, fentanyl, and methadone. Morphine should not be used. Avoid Baclofen and avoid oral sodium phosphate and magnesium citrate based laxatives / bowel preps. Continue strict Input and Output monitoring. Will monitor the patient closely with you and intervene or adjust therapy as indicated by changes in clinical status/labs 6. Will continue intensive monitoring for drug levels and toxicity via regularly scheduled laboratory assays given the narrow therapeutic index of the immunosuppressive drug therapy listed above   Jeffery Quint, MD [place] 01/28/2020, 2:37 PM

## 2020-01-28 NOTE — H&P (Addendum)
History and Physical    [name] ERX:[id number] DOB: 1960/08/07 DOA: 01/27/2020  Referring MD/NP/PA: Harrold Donath, MD PCP: Horald Pollen, MD  Patient coming from: [facility] transfer  Chief Complaint: Abdominal pain  I have personally briefly reviewed patient's old medical records in [place]   HPI: [name] is a 59 y.o. 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  Pain was located in the right upper quadrant of his abdomen.  He reported that pain felt like somebody was standing on his stomach.  Reported associated symptoms of nausea and vomiting.  Pain is worsened with trying to take a deep breath in. He is on chronic immunosuppressive therapy of cyclosporine and prednisone for history of renal transplant back in May 2020.  Denies having any fever, change in urine output,  ED Course: Upon admission into the emergency department patient was noted to be afebrile with blood pressures 109/72-161/89, and all other vital signs maintained.  Labs significant for hemoglobin 12, sodium 132, BUN 24, and creatinine 1.01.  Liver function studies and lipase are within normal limits.  CT scan of the abdomen and pelvis revealed acute cholecystitis with marked gallbladder wall thickening and no signs of gallstones.  Patient had been given 1 L of normal saline IV fluids, Protonix, fentanyl for pain, Benadryl, and Zosyn.  Attempts were made to transfer to [facility], but waiting list was prolonged.  Dr. Romana Juniper of General surgery here at [facility] was formally consulted, but recommended hospitalist admission.  [facility] called to admit and accepted to a MedSurg bed.  Review of Systems  Constitutional: Negative for fever.  HENT: Negative for congestion.     Eyes: Negative for photophobia and discharge.  Respiratory: Positive for shortness of breath (Secondary to pain). Negative for cough and stridor.   Cardiovascular: Negative for chest pain and leg swelling.  Gastrointestinal: Positive for abdominal pain and vomiting. Negative for diarrhea.  Genitourinary: Negative for dysuria and frequency.  Musculoskeletal: Negative for joint pain and myalgias.  Skin: Negative for itching.  Psychiatric/Behavioral: Negative for memory loss and substance abuse.    Past Medical History:  Diagnosis Date  . 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 HTN (hypertension)   . Hypertension    Phreesia 01/05/2020  . Renal disease   . Status post radiation therapy 07/17/14-08/28/14   Tongue  . Tongue cancer [facility])    right    Past Surgical History:  Procedure Laterality Date  . HIP SURGERY     bone transplant per pt to both hips  . JOINT REPLACEMENT N/A    Phreesia 01/05/2020  . KIDNEY TRANSPLANT  11/1998  . NECK DISSECTION Bilateral 05/23/14  . PARTIAL GLOSSECTOMY Bilateral 05/23/14   Dr. Conley Canal [facility]  . perm cath placed and removed    . SKIN GRAFT  05/23/14   split thickness skin graft neck poss  . submental flap  05/23/14  . TOTAL HIP ARTHROPLASTY Bilateral   . TRACHEOSTOMY  05/23/14     reports that he has never smoked. He quit smokeless tobacco use about 5 years ago.  His smokeless tobacco use included chew. He reports current alcohol use of about 6.0 standard drinks of alcohol per week. He reports that he does not use drugs.  Allergies  Allergen Reactions  .  Diflucan [Fluconazole] Other (See Comments)    Incompatible with cyclosporine  . Contrast Media [Iodinated Diagnostic Agents] Rash    Pt tolerates benadryl pre scan without post scan complications   . Vancomycin Rash    Family History  Problem Relation Age of Onset  . Thyroid disease Mother     Prior to Admission medications   Medication Sig Start Date End  Date Taking? Authorizing Provider  amoxicillin (AMOXIL) 500 MG capsule Take 500 mg by mouth as needed.    [provider]  cycloSPORINE (SANDIMMUNE) 100 MG capsule Take 100 mg by mouth 2 (two) times daily. 125 mg in morning and  100 mg at night    [provider]  cycloSPORINE modified (NEORAL) 25 MG capsule  03/04/17   [provider]  levothyroxine (SYNTHROID) 50 MCG tablet Take 1 tablet (50 mcg total) by mouth daily. 11/09/19 02/07/20  Horald Pollen, MD  losartan (COZAAR) 50 MG tablet  01/17/17   [provider]  predniSONE (DELTASONE) 5 MG tablet Take 5 mg by mouth.    [provider]    Physical Exam:  Constitutional: Elderly male currently in no acute distress Vitals:   01/28/20 0331 01/28/20 0354 01/28/20 0542 01/28/20 0723  BP: (!) 161/89 (!) 161/89 124/74 109/72  Pulse: 88 91 89 89  Resp: 15 18 17 16   Temp:  99.5 F (37.5 C)    TempSrc:      SpO2: 99% 100% 99% 98%  Weight:      Height:       Eyes: PERRL, lids and conjunctivae normal ENMT: Mucous membranes are moist. Posterior pharynx clear of any exudate or lesions.Normal dentition.  Neck: normal, supple, no masses, no thyromegaly Respiratory: clear to auscultation bilaterally, no wheezing, no crackles. Normal respiratory effort. No accessory muscle use.  Patient currently on 2 L of nasal cannula oxygen with O2 saturations maintained. Cardiovascular: Regular rate and rhythm, no murmurs / rubs / gallops. No extremity edema. 2+ pedal pulses. No carotid bruits.  Abdomen: Mild tenderness to palpation of the right upper quadrant after recently receiving pain medication.  Bowel sounds present. Musculoskeletal: no clubbing / cyanosis. No joint deformity upper and lower extremities. Good ROM, no contractures. Normal muscle tone.  Skin: no rashes, lesions, ulcers. No induration Neurologic: CN 2-12 grossly intact. Sensation intact, DTR normal. Strength 5/5 in all 4.  Psychiatric: Normal  judgment and insight. Alert and oriented x 3. Normal mood.     Labs on Admission: I have personally reviewed following labs and imaging studies  CBC: Recent Labs  Lab 01/27/20 2214  WBC 10.0  NEUTROABS 8.4*  HGB 12.0*  HCT 35.9*  MCV 95.5  PLT 921   Basic Metabolic Panel: Recent Labs  Lab 01/27/20 2214  NA 132*  K 3.8  CL 97*  CO2 24  GLUCOSE 137*  BUN 24*  CREATININE 1.01  CALCIUM 8.9   GFR: Estimated Creatinine Clearance: 84.8 mL/min (by C-G formula based on SCr of 1.01 mg/dL). Liver Function Tests: Recent Labs  Lab 01/27/20 2214  AST 27  ALT 28  ALKPHOS 56  BILITOT 0.9  PROT 6.8  ALBUMIN 3.7   Recent Labs  Lab 01/27/20 2214  LIPASE 44   No results for input(s): AMMONIA in the last 168 hours. Coagulation Profile: No results for input(s): INR, PROTIME in the last 168 hours. Cardiac Enzymes: No results for input(s): CKTOTAL, CKMB, CKMBINDEX, TROPONINI in the last 168 hours. BNP (last 3 results) No results for  input(s): PROBNP in the last 8760 hours. HbA1C: No results for input(s): HGBA1C in the last 72 hours. CBG: No results for input(s): GLUCAP in the last 168 hours. Lipid Profile: No results for input(s): CHOL, HDL, LDLCALC, TRIG, CHOLHDL, LDLDIRECT in the last 72 hours. Thyroid Function Tests: No results for input(s): TSH, T4TOTAL, FREET4, T3FREE, THYROIDAB in the last 72 hours. Anemia Panel: No results for input(s): VITAMINB12, FOLATE, FERRITIN, TIBC, IRON, RETICCTPCT in the last 72 hours. Urine analysis:    Component Value Date/Time   COLORURINE YELLOW 01/27/2020 2251   APPEARANCEUR CLEAR 01/27/2020 2251   LABSPEC 1.020 01/27/2020 2251   PHURINE 7.5 01/27/2020 2251   GLUCOSEU NEGATIVE 01/27/2020 2251   HGBUR SMALL (A) 01/27/2020 2251   BILIRUBINUR NEGATIVE 01/27/2020 2251   KETONESUR NEGATIVE 01/27/2020 2251   PROTEINUR NEGATIVE 01/27/2020 2251   UROBILINOGEN 0.2 01/07/2011 1547   NITRITE NEGATIVE 01/27/2020 2251   LEUKOCYTESUR  NEGATIVE 01/27/2020 2251   Sepsis Labs: Recent Results (from the past 240 hour(s))  SARS Coronavirus 2 by RT PCR (hospital order, performed in [facility] hospital lab) Nasopharyngeal Nasopharyngeal Swab     Status: None   Collection Time: 01/28/20  3:55 AM   Specimen: Nasopharyngeal Swab  Result Value Ref Range Status   SARS Coronavirus 2 NEGATIVE NEGATIVE Final    Comment: (NOTE) SARS-CoV-2 target nucleic acids are NOT DETECTED.  The SARS-CoV-2 RNA is generally detectable in upper and lower respiratory specimens during the acute phase of infection. The lowest concentration of SARS-CoV-2 viral copies this assay can detect is 250 copies / mL. A negative result does not preclude SARS-CoV-2 infection and should not be used as the sole basis for treatment or other patient management decisions.  A negative result may occur with improper specimen collection / handling, submission of specimen other than nasopharyngeal swab, presence of viral mutation(s) within the areas targeted by this assay, and inadequate number of viral copies (<250 copies / mL). A negative result must be combined with clinical observations, patient history, and epidemiological information.  Fact Sheet for Patients:   StrictlyIdeas.no  Fact Sheet for Healthcare Providers: BankingDealers.co.za  This test is not yet approved or  cleared by the [place] FDA and has been authorized for detection and/or diagnosis of SARS-CoV-2 by FDA under an Emergency Use Authorization (EUA).  This EUA will remain in effect (meaning this test can be used) for the duration of the COVID-19 declaration under Section 564(b)(1) of the Act, 21 U.S.C. section 360bbb-3(b)(1), unless the authorization is terminated or revoked sooner.  Performed at [facility], [address] [place], [place] [address]      Radiological Exams on Admission: CT Angio Abd/Pel W and/or Wo  Contrast  Result Date: 01/28/2020 CLINICAL DATA:  Epigastric pain, vomiting, history of renal transplant EXAM: CTA ABDOMEN AND PELVIS WITHOUT AND WITH CONTRAST TECHNIQUE: Multidetector CT imaging of the abdomen and pelvis was performed using the standard protocol during bolus administration of intravenous contrast. Multiplanar reconstructed images and MIPs were obtained and reviewed to evaluate the vascular anatomy. CONTRAST:  15mL OMNIPAQUE IOHEXOL 350 MG/ML SOLN COMPARISON:  04/30/2015 FINDINGS: VASCULAR Aorta: Normal caliber aorta without aneurysm, dissection, vasculitis or significant stenosis. Celiac: Patent without evidence of aneurysm, dissection, vasculitis or significant stenosis. SMA: Patent without evidence of aneurysm, dissection, vasculitis or significant stenosis. Renals: Native renal arteries are markedly atretic, consistent with end-stage renal disease. Transplant renal artery off the right external iliac artery is widely patent. IMA: Patent without evidence of  aneurysm, dissection, vasculitis or significant stenosis. Inflow: Patent without evidence of aneurysm, dissection, vasculitis or significant stenosis. Proximal Outflow: Bilateral common femoral and visualized portions of the superficial and profunda femoral arteries are patent without evidence of aneurysm, dissection, vasculitis or significant stenosis. Veins: No obvious venous abnormality within the limitations of this arterial phase study. Review of the MIP images confirms the above findings. NON-VASCULAR Lower chest: No acute pleural or parenchymal lung disease. Hepatobiliary: Gallbladder is markedly distended with gallbladder wall thickening, intramural edema, and significant pericholecystic fat stranding consistent with acute cholecystitis. No calcified gallstones. There are no focal liver abnormalities. Pancreas: Unremarkable. No pancreatic ductal dilatation or surrounding inflammatory changes. Spleen: Normal in size without focal  abnormality. Adrenals/Urinary Tract: The native kidneys are markedly atrophic with cortical calcifications consistent with end-stage renal disease. Transplant kidney in the right lower quadrant enhances normally, with no focal abnormality. Bladder is moderately distended with no gross abnormalities. Evaluation is limited due to streak artifact from bilateral hip arthroplasties. Stomach/Bowel: No bowel obstruction or ileus. No wall thickening or inflammatory change. Lymphatic: No pathologic adenopathy. Reproductive: Prostate is unremarkable. Other: Trace free fluid in the right upper quadrant associated with acute cholecystitis. No free intraperitoneal gas. Musculoskeletal: Bilateral hip arthroplasties. No acute or destructive bony lesions. IMPRESSION: 1. Acute cholecystitis, with marked gallbladder wall thickening, pericholecystic fluid, and gallbladder distension. No calcified gallstones. 2. Mild atherosclerosis throughout the aorta, with no significant vascular abnormality. 3. Unremarkable right lower quadrant renal transplant. Electronically Signed   By: Randa Ngo M.D.   On: 01/28/2020 02:44    EKG: Independently reviewed.  Normal sinus rhythm at 86 bpm  Assessment/Plan Acute acalculous cholecystitis: Patient presented with right upper quadrant abdominal pain with nausea and vomiting.  CT scan significant for gallbladder wall thickening concerning for acute cholecystitis without signs of a gallstone.  Labs did not reveal -Admit to a MedSurg bed -Clear liquid diet per surgery -IV fentanyl as needed for pain -IV fluids normal saline at 75 mL/h -Continue IV Zosyn -Follow-up renal ultrasound ordered by surgery -Appreciate general surgery consultative services, we will follow further recommendation  Hyponatremia: Acute. Sodium noted to be 132 on admission.  Suspect likely hypovolemic hyponatremia given reports of nausea and vomiting due to above. -IV fluids as seen above -Continue to  monitor  Status post renal transplant on chronic immunosuppressive therapy: Stable.  Kidney function appears to be near patient's baseline.  Medications include prednisone 5 mg daily, cyclosporine 125 mg every morning, and 100 mg at night.  Patient follows with Dr. Posey Pronto of [facility]. -Continue home medication regimen -Dr. Candiss Norse of [facility] formally consulted to follow with the patient while in hospital.  Essential hypertension: Blood pressure initially noted to be soft 97/66.  Home medications include losartan 50 mg daily. -Held losartan for now due to soft blood pressures -Continue IV fluids -We will consider need of stress dosing  Normocytic anemia: Stable.  Hemoglobin 12 on admission which appears near his baseline. -Continue to monitor  Hypothyroidism: Last TSH noted to be 3.24 11/09/2019.  Medications include levothyroxine 50 mcg daily. 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 -Continue outpatient follow-up  DVT prophylaxis: Lovenox Code Status: Full Family Communication: Wife updated at bedside Disposition Plan: Likely discharge home once medically stable Consults called: General surgery Admission status: Inpatient  Norval Morton MD [facility] Pager [phone]   If 7PM-7AM, please contact  night-coverage [email or web address] Password [email or web address]  01/28/2020, 7:51 AM

## 2020-01-29 ENCOUNTER — Encounter (HOSPITAL_COMMUNITY): Payer: Self-pay | Admitting: Family Medicine

## 2020-01-29 ENCOUNTER — Other Ambulatory Visit (HOSPITAL_COMMUNITY): Payer: 59

## 2020-01-29 ENCOUNTER — Inpatient Hospital Stay (HOSPITAL_COMMUNITY): Payer: Medicare Other

## 2020-01-29 ENCOUNTER — Inpatient Hospital Stay (HOSPITAL_COMMUNITY): Payer: Medicare Other | Admitting: Critical Care Medicine

## 2020-01-29 ENCOUNTER — Encounter (HOSPITAL_COMMUNITY)
Admission: EM | Disposition: A | Discharge status: Home / Self Care | Payer: Self-pay | Source: Home / Self Care | Attending: Internal Medicine

## 2020-01-29 HISTORY — PX: CHOLECYSTECTOMY: SHX55

## 2020-01-29 LAB — BASIC METABOLIC PANEL
Anion gap: 9 (ref 5–15)
BUN: 33 mg/dL — ABNORMAL HIGH (ref 6–20)
CO2: 23 mmol/L (ref 22–32)
Calcium: 8 mg/dL — ABNORMAL LOW (ref 8.9–10.3)
Chloride: 100 mmol/L (ref 98–111)
Creatinine, Ser: 1.93 mg/dL — ABNORMAL HIGH (ref 0.61–1.24)
GFR calc Af Amer: 43 mL/min — ABNORMAL LOW (ref >60)
GFR calc non Af Amer: 37 mL/min — ABNORMAL LOW (ref >60)
Glucose, Bld: 97 mg/dL (ref 70–99)
Potassium: 4.7 mmol/L (ref 3.5–5.1)
Sodium: 132 mmol/L — ABNORMAL LOW (ref 135–145)

## 2020-01-29 LAB — CBC WITH DIFFERENTIAL/PLATELET
Abs Immature Granulocytes: 0.05 10*3/uL (ref 0.00–0.07)
Basophils Absolute: 0 10*3/uL (ref 0.0–0.1)
Basophils Relative: 0 %
Eosinophils Absolute: 0 10*3/uL (ref 0.0–0.5)
Eosinophils Relative: 0 %
HCT: 34 % — ABNORMAL LOW (ref 39.0–52.0)
Hemoglobin: 11.3 g/dL — ABNORMAL LOW (ref 13.0–17.0)
Immature Granulocytes: 0 %
Lymphocytes Relative: 2 %
Lymphs Abs: 0.2 10*3/uL — ABNORMAL LOW (ref 0.7–4.0)
MCH: 32.7 pg (ref 26.0–34.0)
MCHC: 33.2 g/dL (ref 30.0–36.0)
MCV: 98.3 fL (ref 80.0–100.0)
Monocytes Absolute: 0.2 10*3/uL (ref 0.1–1.0)
Monocytes Relative: 2 %
Neutro Abs: 11.5 10*3/uL — ABNORMAL HIGH (ref 1.7–7.7)
Neutrophils Relative %: 96 %
Platelets: 160 10*3/uL (ref 150–400)
RBC: 3.46 MIL/uL — ABNORMAL LOW (ref 4.22–5.81)
RDW: 13.8 % (ref 11.5–15.5)
WBC: 12 10*3/uL — ABNORMAL HIGH (ref 4.0–10.5)
nRBC: 0 % (ref 0.0–0.2)

## 2020-01-29 LAB — CBC
HCT: 33.9 % — ABNORMAL LOW (ref 39.0–52.0)
Hemoglobin: 11.2 g/dL — ABNORMAL LOW (ref 13.0–17.0)
MCH: 31.5 pg (ref 26.0–34.0)
MCHC: 33 g/dL (ref 30.0–36.0)
MCV: 95.5 fL (ref 80.0–100.0)
Platelets: 170 10*3/uL (ref 150–400)
RBC: 3.55 MIL/uL — ABNORMAL LOW (ref 4.22–5.81)
RDW: 13.5 % (ref 11.5–15.5)
WBC: 14 10*3/uL — ABNORMAL HIGH (ref 4.0–10.5)
nRBC: 0 % (ref 0.0–0.2)

## 2020-01-29 LAB — COMPREHENSIVE METABOLIC PANEL
ALT: 63 U/L — ABNORMAL HIGH (ref 0–44)
AST: 97 U/L — ABNORMAL HIGH (ref 15–41)
Albumin: 2.3 g/dL — ABNORMAL LOW (ref 3.5–5.0)
Alkaline Phosphatase: 45 U/L (ref 38–126)
Anion gap: 8 (ref 5–15)
BUN: 39 mg/dL — ABNORMAL HIGH (ref 6–20)
CO2: 24 mmol/L (ref 22–32)
Calcium: 7.9 mg/dL — ABNORMAL LOW (ref 8.9–10.3)
Chloride: 101 mmol/L (ref 98–111)
Creatinine, Ser: 1.91 mg/dL — ABNORMAL HIGH (ref 0.61–1.24)
GFR calc Af Amer: 43 mL/min — ABNORMAL LOW (ref >60)
GFR calc non Af Amer: 37 mL/min — ABNORMAL LOW (ref >60)
Glucose, Bld: 123 mg/dL — ABNORMAL HIGH (ref 70–99)
Potassium: 4.3 mmol/L (ref 3.5–5.1)
Sodium: 133 mmol/L — ABNORMAL LOW (ref 135–145)
Total Bilirubin: 1.3 mg/dL — ABNORMAL HIGH (ref 0.3–1.2)
Total Protein: 5.6 g/dL — ABNORMAL LOW (ref 6.5–8.1)

## 2020-01-29 LAB — NA AND K (SODIUM & POTASSIUM), RAND UR
Potassium Urine: 80 mmol/L
Sodium, Ur: 16 mmol/L

## 2020-01-29 LAB — CREATININE, URINE, RANDOM: Creatinine, Urine: 147.53 mg/dL

## 2020-01-29 SURGERY — LAPAROSCOPIC CHOLECYSTECTOMY
Anesthesia: General

## 2020-01-29 MED ORDER — DEXAMETHASONE SODIUM PHOSPHATE 10 MG/ML IJ SOLN
INTRAMUSCULAR | Status: AC
  Filled 2020-01-29: qty 1

## 2020-01-29 MED ORDER — PROMETHAZINE HCL 25 MG/ML IJ SOLN: 6.2500 mg | INTRAMUSCULAR | Status: DC | PRN

## 2020-01-29 MED ORDER — ROCURONIUM BROMIDE 10 MG/ML (PF) SYRINGE
PREFILLED_SYRINGE | INTRAVENOUS | Status: DC | PRN
  Administered 2020-01-29: 40 mg via INTRAVENOUS

## 2020-01-29 MED ORDER — BUPIVACAINE HCL (PF) 0.25 % IJ SOLN
INTRAMUSCULAR | Status: AC
  Filled 2020-01-29: qty 30

## 2020-01-29 MED ORDER — ACETAMINOPHEN 500 MG PO TABS: 1000.0000 mg | ORAL_TABLET | Freq: Once | ORAL | Status: AC

## 2020-01-29 MED ORDER — SODIUM CHLORIDE 0.9 % IV SOLN
INTRAVENOUS | Status: DC
  Administered 2020-01-29: 1000 mL via INTRAVENOUS

## 2020-01-29 MED ORDER — 0.9 % SODIUM CHLORIDE (POUR BTL) OPTIME
TOPICAL | Status: DC | PRN
  Administered 2020-01-29: 1000 mL

## 2020-01-29 MED ORDER — ROCURONIUM BROMIDE 10 MG/ML (PF) SYRINGE
PREFILLED_SYRINGE | INTRAVENOUS | Status: AC
  Filled 2020-01-29: qty 10

## 2020-01-29 MED ORDER — SODIUM CHLORIDE 0.9 % IR SOLN
Status: DC | PRN
  Administered 2020-01-29: 1000 mL

## 2020-01-29 MED ORDER — PHENYLEPHRINE 40 MCG/ML (10ML) SYRINGE FOR IV PUSH (FOR BLOOD PRESSURE SUPPORT)
PREFILLED_SYRINGE | INTRAVENOUS | Status: AC
  Filled 2020-01-29: qty 10

## 2020-01-29 MED ORDER — ONDANSETRON HCL 4 MG/2ML IJ SOLN
INTRAMUSCULAR | Status: AC
  Filled 2020-01-29: qty 2

## 2020-01-29 MED ORDER — SUGAMMADEX SODIUM 200 MG/2ML IV SOLN
INTRAVENOUS | Status: DC | PRN
  Administered 2020-01-29: 180 mg via INTRAVENOUS

## 2020-01-29 MED ORDER — PROPOFOL 10 MG/ML IV BOLUS
INTRAVENOUS | Status: DC | PRN
  Administered 2020-01-29: 150 mg via INTRAVENOUS

## 2020-01-29 MED ORDER — FENTANYL CITRATE (PF) 250 MCG/5ML IJ SOLN
INTRAMUSCULAR | Status: AC
  Filled 2020-01-29: qty 5

## 2020-01-29 MED ORDER — SODIUM CHLORIDE 0.9 % IV SOLN: INTRAVENOUS | Status: DC

## 2020-01-29 MED ORDER — TRAMADOL HCL 50 MG PO TABS: 50.0000 mg | ORAL_TABLET | Freq: Four times a day (QID) | ORAL | Status: DC | PRN

## 2020-01-29 MED ORDER — PHENYLEPHRINE HCL (PRESSORS) 10 MG/ML IV SOLN
INTRAVENOUS | Status: DC | PRN
Start: 2020-01-29 — End: 2020-01-29
  Administered 2020-01-29: 120 ug via INTRAVENOUS
  Administered 2020-01-29: 80 ug via INTRAVENOUS
  Administered 2020-01-29: 120 ug via INTRAVENOUS
  Administered 2020-01-29: 80 ug via INTRAVENOUS

## 2020-01-29 MED ORDER — MIDAZOLAM HCL 2 MG/2ML IJ SOLN
INTRAMUSCULAR | Status: AC
  Filled 2020-01-29: qty 2

## 2020-01-29 MED ORDER — HYDROMORPHONE HCL 1 MG/ML IJ SOLN: 1.0000 mg | INTRAMUSCULAR | Status: DC | PRN

## 2020-01-29 MED ORDER — MIDAZOLAM HCL 5 MG/5ML IJ SOLN
INTRAMUSCULAR | Status: DC | PRN
  Administered 2020-01-29: 2 mg via INTRAVENOUS

## 2020-01-29 MED ORDER — CHLORHEXIDINE GLUCONATE 0.12 % MT SOLN: 15.0000 mL | Freq: Once | OROMUCOSAL | Status: AC

## 2020-01-29 MED ORDER — ONDANSETRON HCL 4 MG/2ML IJ SOLN
INTRAMUSCULAR | Status: DC | PRN
  Administered 2020-01-29: 4 mg via INTRAVENOUS

## 2020-01-29 MED ORDER — ACETAMINOPHEN 500 MG PO TABS
ORAL_TABLET | ORAL | Status: AC
  Administered 2020-01-29: 1000 mg via ORAL
  Filled 2020-01-29: qty 2

## 2020-01-29 MED ORDER — PHENYLEPHRINE HCL-NACL 10-0.9 MG/250ML-% IV SOLN
INTRAVENOUS | Status: DC | PRN
Start: 2020-01-29 — End: 2020-01-29
  Administered 2020-01-29: 50 ug/min via INTRAVENOUS

## 2020-01-29 MED ORDER — LIDOCAINE 2% (20 MG/ML) 5 ML SYRINGE
INTRAMUSCULAR | Status: AC
  Filled 2020-01-29: qty 5

## 2020-01-29 MED ORDER — ORAL CARE MOUTH RINSE: 15.0000 mL | Freq: Once | OROMUCOSAL | Status: AC

## 2020-01-29 MED ORDER — BUPIVACAINE HCL (PF) 0.25 % IJ SOLN
INTRAMUSCULAR | Status: DC | PRN
  Administered 2020-01-29: 20 mL

## 2020-01-29 MED ORDER — MAGNESIUM SULFATE 4 GM/100ML IV SOLN
4.0000 g | Freq: Once | INTRAVENOUS | Status: AC
  Administered 2020-01-29: 4 g via INTRAVENOUS
  Filled 2020-01-29: qty 100

## 2020-01-29 MED ORDER — FENTANYL CITRATE (PF) 100 MCG/2ML IJ SOLN: 25.0000 ug | INTRAMUSCULAR | Status: DC | PRN

## 2020-01-29 MED ORDER — DEXAMETHASONE SODIUM PHOSPHATE 10 MG/ML IJ SOLN
INTRAMUSCULAR | Status: DC | PRN
  Administered 2020-01-29: 4 mg via INTRAVENOUS

## 2020-01-29 MED ORDER — ENOXAPARIN SODIUM 40 MG/0.4ML ~~LOC~~ SOLN
40.0000 mg | SUBCUTANEOUS | Status: DC
  Administered 2020-01-30: 40 mg via SUBCUTANEOUS
  Filled 2020-01-29: qty 0.4

## 2020-01-29 MED ORDER — LIDOCAINE 2% (20 MG/ML) 5 ML SYRINGE
INTRAMUSCULAR | Status: DC | PRN
  Administered 2020-01-29: 80 mg via INTRAVENOUS

## 2020-01-29 MED ORDER — FENTANYL CITRATE (PF) 250 MCG/5ML IJ SOLN
INTRAMUSCULAR | Status: DC | PRN
  Administered 2020-01-29: 50 ug via INTRAVENOUS
  Administered 2020-01-29: 100 ug via INTRAVENOUS
  Administered 2020-01-29: 50 ug via INTRAVENOUS

## 2020-01-29 MED ORDER — CHLORHEXIDINE GLUCONATE 0.12 % MT SOLN
OROMUCOSAL | Status: AC
  Administered 2020-01-29: 15 mL via OROMUCOSAL
  Filled 2020-01-29: qty 15

## 2020-01-29 SURGICAL SUPPLY — 33 items
APPLIER CLIP 5 13 M/L LIGAMAX5 (MISCELLANEOUS) ×3
CANISTER SUCT 3000ML PPV (MISCELLANEOUS) ×3 IMPLANT
CHLORAPREP W/TINT 26 (MISCELLANEOUS) ×3 IMPLANT
CLIP APPLIE 5 13 M/L LIGAMAX5 (MISCELLANEOUS) ×1 IMPLANT
COVER SURGICAL LIGHT HANDLE (MISCELLANEOUS) ×3 IMPLANT
COVER WAND RF STERILE (DRAPES) ×3 IMPLANT
DERMABOND ADVANCED (GAUZE/BANDAGES/DRESSINGS) ×2
DERMABOND ADVANCED .7 DNX12 (GAUZE/BANDAGES/DRESSINGS) ×1 IMPLANT
ELECT REM PT RETURN 9FT ADLT (ELECTROSURGICAL) ×3
ELECTRODE REM PT RTRN 9FT ADLT (ELECTROSURGICAL) ×1 IMPLANT
GLOVE SURG SIGNA 7.5 PF LTX (GLOVE) ×12 IMPLANT
GOWN STRL REUS W/ TWL LRG LVL3 (GOWN DISPOSABLE) ×2 IMPLANT
GOWN STRL REUS W/ TWL XL LVL3 (GOWN DISPOSABLE) ×2 IMPLANT
GOWN STRL REUS W/TWL LRG LVL3 (GOWN DISPOSABLE) ×6
GOWN STRL REUS W/TWL XL LVL3 (GOWN DISPOSABLE) ×6
HEMOSTAT SNOW SURGICEL 2X4 (HEMOSTASIS) ×3 IMPLANT
KIT BASIN OR (CUSTOM PROCEDURE TRAY) ×3 IMPLANT
KIT TURNOVER KIT B (KITS) ×3 IMPLANT
NS IRRIG 1000ML POUR BTL (IV SOLUTION) ×3 IMPLANT
PAD ARMBOARD 7.5X6 YLW CONV (MISCELLANEOUS) ×3 IMPLANT
POUCH SPECIMEN RETRIEVAL 10MM (ENDOMECHANICALS) ×3 IMPLANT
SCISSORS LAP 5X35 DISP (ENDOMECHANICALS) ×3 IMPLANT
SET IRRIG TUBING LAPAROSCOPIC (IRRIGATION / IRRIGATOR) ×3 IMPLANT
SET TUBE SMOKE EVAC HIGH FLOW (TUBING) ×3 IMPLANT
SLEEVE ENDOPATH XCEL 5M (ENDOMECHANICALS) ×6 IMPLANT
SPECIMEN JAR SMALL (MISCELLANEOUS) ×3 IMPLANT
SUT MNCRL AB 4-0 PS2 18 (SUTURE) ×3 IMPLANT
SUT VICRYL 0 AB UR-6 (SUTURE) ×3 IMPLANT
TOWEL GREEN STERILE (TOWEL DISPOSABLE) ×3 IMPLANT
TOWEL GREEN STERILE FF (TOWEL DISPOSABLE) ×3 IMPLANT
TRAY LAPAROSCOPIC MC (CUSTOM PROCEDURE TRAY) ×3 IMPLANT
TROCAR XCEL BLUNT TIP 100MML (ENDOMECHANICALS) ×3 IMPLANT
TROCAR XCEL NON-BLD 5MMX100MML (ENDOMECHANICALS) ×3 IMPLANT

## 2020-01-29 NOTE — Op Note (Signed)
LAPAROSCOPIC CHOLECYSTECTOMY  Procedure Note  [name] 01/29/2020   Pre-op Diagnosis: ACUTE CHOLECYSTITIS WITH CHOLELITHIASIS     Post-op Diagnosis: SAME  Procedure(s): LAPAROSCOPIC CHOLECYSTECTOMY  Surgeon(s): Coralie Keens, MD  Anesthesia: General  Staff:  Circulator: Candi Leash, RN; Lamount Cohen, RN Scrub Person: Tereasa Coop, RN; Glean Hess, RN  Estimated Blood Loss: less than 100 mL               Specimens: sent to path  Findings: Patient did have acute cholecystitis with gangrenous changes in the gallbladder and gallstones  Procedure: The patient was brought to operating identifies correct patient.  He is placed upon the operating table general anesthesia was induced.  His abdomen was then prepped and draped in usual sterile fashion.  I made a small vertical incision just below the umbilicus.  I carried this down to the fascia which was then opened the scalpel.  Hemostat was used to pass into the peritoneal cavity under direct vision.  A 0 Vicryl pursing suture was then placed around the fascial opening.  The [facility] port was placed the opening and insufflation of the abdomen was again.  I placed a 5 mm trocar in the patient's epigastrium and 2 more in the right upper quadrant under direct vision.  There was omentum stuck on top of the gallbladder which I was able to peel off bluntly.  The gallbladder was acutely distended and inflamed with gangrenous changes.  As I grasped the gallbladder it opened up and I was able to aspirate bile from it.  The gallbladder self was quite friable.  I was able to dissect out the base the gallbladder and identify the cystic duct.  I achieved a critical window around it.  I then clipped 3 times proximally once distally and transected it.  The cystic artery was identified and clipped proximally and distally and transected as well.  The gallbladder was then slowly dissected free from the liver bed with electrocautery.  Once  it was free from liver bed was placed in an Endosac and removed the incision at the umbilicus.  I then copiously irrigated the right upper quadrant with normal saline.  I was able to achieve hemostasis with the cautery and placed a piece of surgical snow into the gallbladder fossa.  Again, hemostasis appeared to be achieved.  At this point all ports were removed under direct vision and the abdomen was deflated.  The 0 Vicryl the umbilicus was tied in place closing the fascial defect.  I placed another figure-of-eight 0 Vicryl suture at the umbilicus as well.  All incisions were anesthetized Marcaine and closed with 4-0 Monocryl sutures.  Dermabond then applied.  The patient tolerated the procedure well.  All the counts were correct at the end of the procedure.  The patient was then extubated in the operating and taken in stable addition to the recovery room.          Coralie Keens   Date: 01/29/2020  Time: 10:31 AM

## 2020-01-29 NOTE — Anesthesia Preprocedure Evaluation (Signed)
Anesthesia Evaluation  Patient identified by MRN, date of birth, ID band Patient awake    Reviewed: Allergy & Precautions, NPO status , Patient's Chart, lab work & pertinent test results  Airway Mallampati: II  TM Distance: >3 FB Neck ROM: Full    Dental  (+) Teeth Intact, Dental Advisory Given   Pulmonary neg pulmonary ROS,    Pulmonary exam normal breath sounds clear to auscultation       Cardiovascular hypertension, Pt. on medications Normal cardiovascular exam Rhythm:Regular Rate:Normal     Neuro/Psych negative neurological ROS     GI/Hepatic negative GI ROS, Neg liver ROS,   Endo/Other  Hypothyroidism   Renal/GU Renal InsufficiencyRenal diseaseS/p deceased donor kidney transplant in 2000 on prednisone, cyclosporine      Musculoskeletal negative musculoskeletal ROS (+)   Abdominal   Peds  Hematology  (+) Blood dyscrasia, anemia ,   Anesthesia Other Findings Day of surgery medications reviewed with the patient.  Tongue cancer s/p resection/radiation   Reproductive/Obstetrics                             Anesthesia Physical Anesthesia Plan  ASA: III  Anesthesia Plan: General   Post-op Pain Management:    Induction: Intravenous  PONV Risk Score and Plan: 3 and Midazolam, Dexamethasone and Ondansetron  Airway Management Planned: Oral ETT  Additional Equipment:   Intra-op Plan:   Post-operative Plan: Extubation in OR  Informed Consent: I have reviewed the patients History and Physical, chart, labs and discussed the procedure including the risks, benefits and alternatives for the proposed anesthesia with the patient or authorized representative who has indicated his/her understanding and acceptance.     Dental advisory given  Plan Discussed with: CRNA  Anesthesia Plan Comments:         Anesthesia Quick Evaluation

## 2020-01-29 NOTE — Transfer of Care (Signed)
Immediate Anesthesia Transfer of Care Note  Patient: [name]  Procedure(s) Performed: LAPAROSCOPIC CHOLECYSTECTOMY (N/A )  Patient Location: PACU  Anesthesia Type:General  Level of Consciousness: awake, alert  and oriented  Airway & Oxygen Therapy: Patient Spontanous Breathing and Patient connected to nasal cannula oxygen  Post-op Assessment: Report given to RN and Post -op Vital signs reviewed and stable  Post vital signs: Reviewed and stable  Last Vitals:  Vitals Value Taken Time  BP    Temp    Pulse    Resp    SpO2      Last Pain:  Vitals:   01/29/20 0915  TempSrc:   PainSc: 7       Patients Stated Pain Goal: 2 (27/74/12 8786)  Complications: No complications documented.

## 2020-01-29 NOTE — Progress Notes (Signed)
[facility] Progress Note    Assessment/ Plan:   1. Acute kidney Injury likely prolonged prerenal insult, clinically dry. Cr up to 1.9. Contrast study on 7/24 (less likely to be cause of current rise in creatinine) -bolus isotonic fluids now over 4 hours (1L), increased rate to 100cc/hr -transplant renal ultrasound w/ doppler -urine lytes -Avoid nephrotoxic medications including NSAIDs and iodinated intravenous contrast exposure unless the latter is absolutely indicated.  Preferred narcotic agents for pain control are hydromorphone, fentanyl, and methadone. Morphine should not be used. Avoid Baclofen and avoid oral sodium phosphate and magnesium citrate based laxatives / bowel preps. Continue strict Input and Output monitoring. Will monitor the patient closely with you and intervene or adjust therapy as indicated by changes in clinical status/labs  2. 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 Baseline Cr ~1 -continue with prednisone 5 mg daily -continue with cyclosporine 150mg /125mg ; if not able to take this recommend starting stress dose steroids (solucortef) -cytoxan trough ordered (expecting to be low and is probably not in steady state right now). Targeting cyclosporine goal of 100-150 -see above -Will continue intensive monitoring for drug levels and toxicity via regularly scheduled laboratory assays given the narrow therapeutic index of the immunosuppressive drug therapy listed above 3. Acute cholecystitis. OR today per surgery, mgmt per primary and surgery   [name], MD [facility] 01/29/2020, 8:53 AM   Subjective:   No acute events. Feels very dry and urine is darker. Pain is better. No diarrhea/n/v. Npo.   Objective:   BP (!) 115/64 (BP Location: Left Arm)   Pulse 77   Temp 98.4 F (36.9 C) (Oral)   Resp 14   Ht 6' (1.829 m)   Wt 77.1 kg   SpO2 100%   BMI 23.06 kg/m   Intake/Output Summary  (Last 24 hours) at 01/29/2020 0853 Last data filed at 01/29/2020 0300 Gross per 24 hour  Intake 1312.52 ml  Output 400 ml  Net 912.52 ml   Weight change: 0 kg  Physical Exam: LAG:TXMIWOEH appearing, nad HEENT: dry mucosal membranes CVS: s1s2, rrr, no m/r/g Resp:cta bl, no w/r/r/c, unlabored, bl chest expansion, speaking in full sentences Abd: no tenderness on palpation over allograft site, slightly distended  Ext: no edema Skin: poor skin turgor Neuro: speech clear and coherent, aaox3, no focal deficits  Imaging: CT Angio Abd/Pel W and/or Wo Contrast  Result Date: 01/28/2020 CLINICAL DATA:  Epigastric pain, vomiting, history of renal transplant EXAM: CTA ABDOMEN AND PELVIS WITHOUT AND WITH CONTRAST TECHNIQUE: Multidetector CT imaging of the abdomen and pelvis was performed using the standard protocol during bolus administration of intravenous contrast. Multiplanar reconstructed images and MIPs were obtained and reviewed to evaluate the vascular anatomy. CONTRAST:  164mL OMNIPAQUE IOHEXOL 350 MG/ML SOLN COMPARISON:  04/30/2015 FINDINGS: VASCULAR Aorta: Normal caliber aorta without aneurysm, dissection, vasculitis or significant stenosis. Celiac: Patent without evidence of aneurysm, dissection, vasculitis or significant stenosis. SMA: Patent without evidence of aneurysm, dissection, vasculitis or significant stenosis. Renals: Native renal arteries are markedly atretic, consistent with end-stage renal disease. Transplant renal artery off the right external iliac artery is widely patent. IMA: Patent without evidence of aneurysm, dissection, vasculitis or significant stenosis. Inflow: Patent without evidence of aneurysm, dissection, vasculitis or significant stenosis. Proximal Outflow: Bilateral common femoral and visualized portions of the superficial and profunda femoral arteries are patent without evidence of aneurysm, dissection, vasculitis or significant stenosis. Veins: No obvious venous  abnormality within the  limitations of this arterial phase study. Review of the MIP images confirms the above findings. NON-VASCULAR Lower chest: No acute pleural or parenchymal lung disease. Hepatobiliary: Gallbladder is markedly distended with gallbladder wall thickening, intramural edema, and significant pericholecystic fat stranding consistent with acute cholecystitis. No calcified gallstones. There are no focal liver abnormalities. Pancreas: Unremarkable. No pancreatic ductal dilatation or surrounding inflammatory changes. Spleen: Normal in size without focal abnormality. Adrenals/Urinary Tract: The native kidneys are markedly atrophic with cortical calcifications consistent with end-stage renal disease. Transplant kidney in the right lower quadrant enhances normally, with no focal abnormality. Bladder is moderately distended with no gross abnormalities. Evaluation is limited due to streak artifact from bilateral hip arthroplasties. Stomach/Bowel: No bowel obstruction or ileus. No wall thickening or inflammatory change. Lymphatic: No pathologic adenopathy. Reproductive: Prostate is unremarkable. Other: Trace free fluid in the right upper quadrant associated with acute cholecystitis. No free intraperitoneal gas. Musculoskeletal: Bilateral hip arthroplasties. No acute or destructive bony lesions. IMPRESSION: 1. Acute cholecystitis, with marked gallbladder wall thickening, pericholecystic fluid, and gallbladder distension. No calcified gallstones. 2. Mild atherosclerosis throughout the aorta, with no significant vascular abnormality. 3. Unremarkable right lower quadrant renal transplant. Electronically Signed   By: Randa Ngo M.D.   On: 01/28/2020 02:44   US ABDOMEN LIMITED RUQ  Result Date: 01/28/2020 CLINICAL DATA:  Possible cholecystitis. EXAM: ULTRASOUND ABDOMEN LIMITED RIGHT UPPER QUADRANT COMPARISON:  CT earlier today. FINDINGS: Exam difficult due to patient body habitus. Gallbladder: Moderate  cholelithiasis and sludge with largest stone measuring 6 mm. Negative sonographic Murphy sign. Small amount of pericholecystic fluid is present. Edematous gallbladder wall thickening measuring 9 mm. Common bile duct: Diameter: 3.6 mm. Liver: No focal lesion identified. Within normal limits in parenchymal echogenicity. Portal vein is patent on color Doppler imaging with normal direction of blood flow towards the liver. Other: None. IMPRESSION: Moderate cholelithiasis and sludge as well as moderate edematous gallbladder wall thickening and minimal adjacent free fluid as findings are compatible with acute cholecystitis. Electronically Signed   By: Marin Olp M.D.   On: 01/28/2020 17:03    Labs: BMET Recent Labs  Lab 01/27/20 2214 01/28/20 1444 01/29/20 0206  NA 132* 133* 132*  K 3.8 4.8 4.7  CL 97* 98 100  CO2 24 24 23   GLUCOSE 137* 77 97  BUN 24* 24* 33*  CREATININE 1.01 1.46* 1.93*  CALCIUM 8.9 8.6* 8.0*   CBC Recent Labs  Lab 01/27/20 2214 01/29/20 0206  WBC 10.0 14.0*  NEUTROABS 8.4*  --   HGB 12.0* 11.2*  HCT 35.9* 33.9*  MCV 95.5 95.5  PLT 205 170    Medications:    . cycloSPORINE  125 mg Oral QHS  . cycloSPORINE  150 mg Oral Daily  . [START ON 01/30/2020] enoxaparin (LOVENOX) injection  40 mg Subcutaneous Q24H  . levothyroxine  50 mcg Oral Q0600  . pantoprazole (PROTONIX) IV  40 mg Intravenous Q12H  . predniSONE  5 mg Oral Q breakfast  . sodium chloride flush  3 mL Intravenous Q12H

## 2020-01-29 NOTE — Anesthesia Postprocedure Evaluation (Signed)
Anesthesia Post Note  Patient: [name]  Procedure(s) Performed: LAPAROSCOPIC CHOLECYSTECTOMY (N/A )     Patient location during evaluation: PACU Anesthesia Type: General Level of consciousness: awake and alert, awake and oriented Pain management: pain level controlled Vital Signs Assessment: post-procedure vital signs reviewed and stable Respiratory status: spontaneous breathing, nonlabored ventilation, respiratory function stable and patient connected to nasal cannula oxygen Cardiovascular status: blood pressure returned to baseline and stable Postop Assessment: no apparent nausea or vomiting Anesthetic complications: no   No complications documented.  Last Vitals:  Vitals:   01/29/20 1045 01/29/20 1116  BP: (!) 104/64 103/71  Pulse: 74 72  Resp: (!) 10 14  Temp: 36.8 C 36.5 C  SpO2: 100% 98%    Last Pain:  Vitals:   01/29/20 1116  TempSrc: Oral  PainSc:                  Catalina Gravel

## 2020-01-29 NOTE — Anesthesia Procedure Notes (Signed)
Procedure Name: Intubation Date/Time: 01/29/2020 9:36 AM Performed by: Wilburn Cornelia, CRNA Pre-anesthesia Checklist: Patient identified, Emergency Drugs available, Suction available, Patient being monitored and Timeout performed Patient Re-evaluated:Patient Re-evaluated prior to induction Oxygen Delivery Method: Circle system utilized Preoxygenation: Pre-oxygenation with 100% oxygen Induction Type: IV induction Ventilation: Mask ventilation without difficulty Grade View: Grade I Tube type: Oral Tube size: 7.5 mm Number of attempts: 1 Airway Equipment and Method: Video-laryngoscopy and Stylet Placement Confirmation: ETT inserted through vocal cords under direct vision,  positive ETCO2,  CO2 detector and breath sounds checked- equal and bilateral Secured at: 23 cm Tube secured with: Tape Dental Injury: Teeth and Oropharynx as per pre-operative assessment  Comments: Elective glidescope pt s/p radiation therapy for tongue ca - easy to mask ventilate, grade 1 view with glidescope

## 2020-01-29 NOTE — Progress Notes (Signed)
PROGRESS NOTE  [name] OAC:[id number] DOB: 08/11/1960 DOA: 01/27/2020 PCP: Horald Pollen, MD  HPI/Recap of past 24 hours: HPI: [name] is a 59 y.o. male with medical history significant of hypertension, R renal transplant on chronic immunosuppressive therapy, hypothyroidism, and tongue cancer who presented at [facility] as a transfer from [facility] with complaints of an acute onset of abdominal pain approximately 1 hour after eating lunch the day prior to his admission.  Pain is severe and localized in the right upper abdominal quadrant, associated with nausea and vomiting.  Worsened with taking a deep breath.  ED Course:  CT scan of the abdomen and pelvis revealed acute cholecystitis with marked gallbladder wall thickening.  Attempts were made to transfer to [facility], but waiting list was prolonged.  Dr. Romana Juniper of General surgery here at [facility] was formally consulted, but recommended hospitalist admission.  [facility] called to admit and accepted to a MedSurg bed.  Ultrasound of right upper quadrant abdomen 01/28/2020 confirmed gallstones and findings consistent with acute cholecystitis.  Decision was made to proceed with lap chole this AM.  Post lap chole on 01/29/20 by Dr. Evlyn Courier.  01/29/20:  Seen and examined at his bedside post surgery.  His wife and a friend are present in the room. No complaints at this time.  Bowel sounds noted on exam.  On clear liquid diet, denies nausea or abdominal pain.  Will repeat CMP and CBC with diff at 1700.  Low serum magnesium, repleting intravenously, will repeat mag level in the AM.  Assessment/Plan: Principal Problem:   Acute acalculous cholecystitis Active Problems:   Renal transplant recipient   Normocytic anemia   Hypothyroidism   Benign essential HTN  Acute gallstone cholecystitis post lap chole on 01/29/20 by Dr. Evlyn Courier. Presented with acute onset of right upper quadrant abdominal pain of 1 day duration,  associated with nausea and vomiting, CT scan suggestive of acute cholecystitis, gallstone confirmed on RUQ abdomen ultrasound with findings compatible with acute cholecystitis. Seen by general surgery, decision was made to proceed with lap chole Currently on empiric IV antibiotic Zosyn due to chronic immunosuppression Doing well post surgery, started on clear liquid diet, diet as recommended by general surgery Appreciate general surgery's assistance Repeat CBC with differentials and CMP this afternoon and in the morning, follow results Early mobilization, PT in the morning  AKI likely multifactorial prerenal in the setting of dehydration from nausea, vomiting, poor oral intake, acute illness, IV contrast in the setting of right renal transplant. Presented with creatinine of 1.01 with GFR greater than 60 Creatinine up-trending, currently 1.93 with GFR of 37 Received IV contrast, continue IV fluid initiated by nephrology Appreciate nephrology's assistance No evidence of hydronephrosis on ultrasound Closely monitor urine output Avoid nephrotoxic agents, dehydration or hypotension Repeat renal panel this afternoon at 1700 and follow results  Post renal transplant on immunosuppressive agents Ultrasound renal transplant with Doppler ordered 01/29/2020, revealed a normal evaluation No evidence of hydronephrosis Continue prednisone 5 mg daily and cyclosporine 150 mg daily and 125 mg nightly Continue to closely monitor for any acute changes  Leukocytosis secondary to acute gallstone cholecystitis with chronic immunosuppression On IV Zosyn for intra-abdominal infection, day #2. No blood cultures drawn Repeat CBC with differentials at 1700, follow results  Chronic steroid use Of prednisone 5 mg daily chronically as an immunosuppressive regimen for renal transplant, continue PPI for GI prophylaxis Low threshold for stress steroid if becomes hypotensive Closely monitor blood pressure  Hypomagnesemia Presented with serum magnesium of 1.2 Repleted intravenously, magnesium supplement 4 g Repeat magnesium level in the morning  Hypovolemic hyponatremia Presented with serum sodium of 132 Continue normal saline IV fluid infusion Repeat renal panel this afternoon and in the morning  Hypothyroidism Last TSH 3.2 on 11/09/2019 Continue home levothyroxine    Code Status: Full code  Family Communication: Wife at bedside    Consultants:  General surgery  Nephrology  Procedures:  Lap chole on 01/29/2020  Ultrasound renal transplant Doppler on 01/29/2020  Antimicrobials:  IV Zosyn  DVT prophylaxis: Subcu Lovenox daily  Status is: Inpatient    Dispo:  Patient From: Home  Planned Disposition: Home  Expected discharge date: 02/01/20  Medically stable for discharge: No, POD #0 post lap chole, ongoing management for AKI in the setting of renal transplant.         Objective: Vitals:   01/29/20 1040 01/29/20 1045 01/29/20 1116 01/29/20 1435  BP: (!) 114/64 (!) 104/64 103/71 102/68  Pulse: 74 74 72 62  Resp: 12 (!) 10 14 15   Temp: 98.1 F (36.7 C) 98.3 F (36.8 C) 97.7 F (36.5 C) 97.6 F (36.4 C)  TempSrc:   Oral Oral  SpO2: 100% 100% 98% 100%  Weight:      Height:        Intake/Output Summary (Last 24 hours) at 01/29/2020 1516 Last data filed at 01/29/2020 1300 Gross per 24 hour  Intake 2112.52 ml  Output 450 ml  Net 1662.52 ml   Filed Weights   01/27/20 2143 01/28/20 0900  Weight: 77.1 kg 77.1 kg    Exam:  . General: 59 y.o. year-old male pleasant, well developed well nourished in no acute distress.  Alert and oriented x3. . Cardiovascular: Regular rate and rhythm with no rubs or gallops.  No thyromegaly or JVD noted.   Marland Kitchen Respiratory: Clear to auscultation with no wheezes or rales. Good inspiratory effort. . Abdomen: Soft nontender nondistended with normal bowel sounds x4 quadrants. . Musculoskeletal: No lower extremity edema.  2/4 pulses in all 4 extremities. Marland Kitchen Psychiatry: Mood is appropriate for condition and setting   Data Reviewed: CBC: Recent Labs  Lab 01/27/20 2214 01/29/20 0206  WBC 10.0 14.0*  NEUTROABS 8.4*  --   HGB 12.0* 11.2*  HCT 35.9* 33.9*  MCV 95.5 95.5  PLT 205 735   Basic Metabolic Panel: Recent Labs  Lab 01/27/20 2214 01/28/20 1444 01/29/20 0206  NA 132* 133* 132*  K 3.8 4.8 4.7  CL 97* 98 100  CO2 24 24 23   GLUCOSE 137* 77 97  BUN 24* 24* 33*  CREATININE 1.01 1.46* 1.93*  CALCIUM 8.9 8.6* 8.0*  MG  --  1.2*  --    GFR: Estimated Creatinine Clearance: 44.4 mL/min (A) (by C-G formula based on SCr of 1.93 mg/dL (H)). Liver Function Tests: Recent Labs  Lab 01/27/20 2214 01/28/20 1444  AST 27 37  ALT 28 31  ALKPHOS 56 49  BILITOT 0.9 1.7*  PROT 6.8 6.1*  ALBUMIN 3.7 3.0*   Recent Labs  Lab 01/27/20 2214  LIPASE 44   No results for input(s): AMMONIA in the last 168 hours. Coagulation Profile: No results for input(s): INR, PROTIME in the last 168 hours. Cardiac Enzymes: No results for input(s): CKTOTAL, CKMB, CKMBINDEX, TROPONINI in the last 168 hours. BNP (last 3 results) No results for input(s): PROBNP in the last 8760 hours. HbA1C: No results for input(s): HGBA1C in the last 72 hours. CBG: No results  for input(s): GLUCAP in the last 168 hours. Lipid Profile: No results for input(s): CHOL, HDL, LDLCALC, TRIG, CHOLHDL, LDLDIRECT in the last 72 hours. Thyroid Function Tests: No results for input(s): TSH, T4TOTAL, FREET4, T3FREE, THYROIDAB in the last 72 hours. Anemia Panel: No results for input(s): VITAMINB12, FOLATE, FERRITIN, TIBC, IRON, RETICCTPCT in the last 72 hours. Urine analysis:    Component Value Date/Time   COLORURINE YELLOW 01/27/2020 2251   APPEARANCEUR CLEAR 01/27/2020 2251   LABSPEC 1.020 01/27/2020 2251   PHURINE 7.5 01/27/2020 2251   GLUCOSEU NEGATIVE 01/27/2020 2251   HGBUR SMALL (A) 01/27/2020 2251   BILIRUBINUR NEGATIVE 01/27/2020  2251   KETONESUR NEGATIVE 01/27/2020 2251   PROTEINUR NEGATIVE 01/27/2020 2251   UROBILINOGEN 0.2 01/07/2011 1547   NITRITE NEGATIVE 01/27/2020 2251   LEUKOCYTESUR NEGATIVE 01/27/2020 2251   Sepsis Labs: @LABRCNTIP (procalcitonin:4,lacticidven:4)  ) Recent Results (from the past 240 hour(s))  SARS Coronavirus 2 by RT PCR (hospital order, performed in [place] hospital lab) Nasopharyngeal Nasopharyngeal Swab     Status: None   Collection Time: 01/28/20  3:55 AM   Specimen: Nasopharyngeal Swab  Result Value Ref Range Status   SARS Coronavirus 2 NEGATIVE NEGATIVE Final    Comment: (NOTE) SARS-CoV-2 target nucleic acids are NOT DETECTED.  The SARS-CoV-2 RNA is generally detectable in upper and lower respiratory specimens during the acute phase of infection. The lowest concentration of SARS-CoV-2 viral copies this assay can detect is 250 copies / mL. A negative result does not preclude SARS-CoV-2 infection and should not be used as the sole basis for treatment or other patient management decisions.  A negative result may occur with improper specimen collection / handling, submission of specimen other than nasopharyngeal swab, presence of viral mutation(s) within the areas targeted by this assay, and inadequate number of viral copies (<250 copies / mL). A negative result must be combined with clinical observations, patient history, and epidemiological information.  Fact Sheet for Patients:   StrictlyIdeas.no  Fact Sheet for Healthcare Providers: BankingDealers.co.za  This test is not yet approved or  cleared by the [place] FDA and has been authorized for detection and/or diagnosis of SARS-CoV-2 by FDA under an Emergency Use Authorization (EUA).  This EUA will remain in effect (meaning this test can be used) for the duration of the COVID-19 declaration under Section 564(b)(1) of the Act, 21 U.S.C. section 360bbb-3(b)(1), unless  the authorization is terminated or revoked sooner.  Performed at [facility], [address] [place], [place] [address]       Studies: US Renal Transplant w/Doppler  Result Date: 01/29/2020 CLINICAL DATA:  Acute kidney injury.  Kidney transplant 2000. EXAM: ULTRASOUND OF RENAL TRANSPLANT WITH RENAL DOPPLER ULTRASOUND TECHNIQUE: Ultrasound examination of the renal transplant was performed with gray-scale, color and duplex doppler evaluation. COMPARISON:  CTA from previous day FINDINGS: Transplant kidney location: RLQ Transplant Kidney: Renal measurements: 13.4 x 7.7 x 6.3 = volume: 346mL. Normal in size and parenchymal echogenicity. No evidence of mass or hydronephrosis. No peri-transplant fluid collection seen. Color flow in the main renal artery:  Present Color flow in the main renal vein:  Present Duplex Doppler Evaluation: Main Renal Artery Resistive Index: 0.64 Venous waveform in main renal vein:  Present Intrarenal resistive index in upper pole:  0.62 (normal 0.6-0.8; equivocal 0.8-0.9; abnormal >= 0.9) Intrarenal resistive index in lower pole: 0.65 (normal 0.6-0.8; equivocal 0.8-0.9; abnormal >= 0.9) Bladder: Normal for degree of bladder distention. Other findings:  Ureteral jets not  visualized. IMPRESSION: Normal transplant renal vascular Doppler evaluation. No hydronephrosis. Electronically Signed   By: Lucrezia Europe M.D.   On: 01/29/2020 15:06   US ABDOMEN LIMITED RUQ  Result Date: 01/28/2020 CLINICAL DATA:  Possible cholecystitis. EXAM: ULTRASOUND ABDOMEN LIMITED RIGHT UPPER QUADRANT COMPARISON:  CT earlier today. FINDINGS: Exam difficult due to patient body habitus. Gallbladder: Moderate cholelithiasis and sludge with largest stone measuring 6 mm. Negative sonographic Murphy sign. Small amount of pericholecystic fluid is present. Edematous gallbladder wall thickening measuring 9 mm. Common bile duct: Diameter: 3.6 mm. Liver: No focal lesion identified. Within normal limits in  parenchymal echogenicity. Portal vein is patent on color Doppler imaging with normal direction of blood flow towards the liver. Other: None. IMPRESSION: Moderate cholelithiasis and sludge as well as moderate edematous gallbladder wall thickening and minimal adjacent free fluid as findings are compatible with acute cholecystitis. Electronically Signed   By: Marin Olp M.D.   On: 01/28/2020 17:03    Scheduled Meds: . cycloSPORINE  125 mg Oral QHS  . cycloSPORINE  150 mg Oral Daily  . [START ON 01/30/2020] enoxaparin (LOVENOX) injection  40 mg Subcutaneous Q24H  . levothyroxine  50 mcg Oral Q0600  . pantoprazole (PROTONIX) IV  40 mg Intravenous Q12H  . predniSONE  5 mg Oral Q breakfast  . sodium chloride flush  3 mL Intravenous Q12H    Continuous Infusions: . sodium chloride Stopped (01/28/20 0516)  . sodium chloride 100 mL/hr at 01/29/20 1157  . sodium chloride 1,000 mL (01/29/20 0833)  . magnesium sulfate bolus IVPB 4 g (01/29/20 1506)  . piperacillin-tazobactam (ZOSYN)  IV 3.375 g (01/29/20 1459)     LOS: 1 day     Kayleen Memos, MD [facility] Pager [phone]  If 7PM-7AM, please contact night-coverage [email or web address] Password [facility] 01/29/2020, 3:16 PM

## 2020-01-29 NOTE — OR Nursing (Signed)
LIGAMAX ENDOSCOPIC MULTIPLE CLIP APPLIER #[id number], LOT #[id number], EXP. 09/03/24 USED.   SURGICEL SNOW [id number], LOT #[id number], EXP. 05/06/21 USED.

## 2020-01-29 NOTE — Progress Notes (Signed)
Patient ID: [name], male   DOB: 1961-04-26, 60 y.o.   MRN: [id number]   Patient reports less pain today Seen by nephrology Creatinine stable  Ultrasound confirms gallstones and findings consistent with cholecystitis.  Lap chole is recommended. I discussed the procedure in detail.  The patient was given Neurosurgeon.  We discussed the risks and benefits of a laparoscopic cholecystectomy and possible cholangiogram including, but not limited to bleeding, infection, injury to surrounding structures such as the intestine or liver, bile leak, retained gallstones, need to convert to an open procedure, prolonged diarrhea, blood clots such as  DVT, common bile duct injury, anesthesia risks, and possible need for additional procedures.  The likelihood of improvement in symptoms and return to the patient's normal status is good. We discussed the typical post-operative recovery course.  He and his wife agree to proceed with surgery

## 2020-01-30 ENCOUNTER — Encounter (HOSPITAL_COMMUNITY): Payer: Self-pay | Admitting: Surgery

## 2020-01-30 ENCOUNTER — Ambulatory Visit: Payer: 59 | Admitting: Registered Nurse

## 2020-01-30 LAB — COMPREHENSIVE METABOLIC PANEL
ALT: 56 U/L — ABNORMAL HIGH (ref 0–44)
AST: 73 U/L — ABNORMAL HIGH (ref 15–41)
Albumin: 2.4 g/dL — ABNORMAL LOW (ref 3.5–5.0)
Alkaline Phosphatase: 53 U/L (ref 38–126)
Anion gap: 9 (ref 5–15)
BUN: 32 mg/dL — ABNORMAL HIGH (ref 6–20)
CO2: 23 mmol/L (ref 22–32)
Calcium: 8.1 mg/dL — ABNORMAL LOW (ref 8.9–10.3)
Chloride: 103 mmol/L (ref 98–111)
Creatinine, Ser: 1.54 mg/dL — ABNORMAL HIGH (ref 0.61–1.24)
GFR calc Af Amer: 56 mL/min — ABNORMAL LOW (ref >60)
GFR calc non Af Amer: 48 mL/min — ABNORMAL LOW (ref >60)
Glucose, Bld: 101 mg/dL — ABNORMAL HIGH (ref 70–99)
Potassium: 4.3 mmol/L (ref 3.5–5.1)
Sodium: 135 mmol/L (ref 135–145)
Total Bilirubin: 1.2 mg/dL (ref 0.3–1.2)
Total Protein: 6.1 g/dL — ABNORMAL LOW (ref 6.5–8.1)

## 2020-01-30 LAB — CBC WITH DIFFERENTIAL/PLATELET
Abs Immature Granulocytes: 0.05 10*3/uL (ref 0.00–0.07)
Basophils Absolute: 0 10*3/uL (ref 0.0–0.1)
Basophils Relative: 0 %
Eosinophils Absolute: 0 10*3/uL (ref 0.0–0.5)
Eosinophils Relative: 0 %
HCT: 34.4 % — ABNORMAL LOW (ref 39.0–52.0)
Hemoglobin: 11.2 g/dL — ABNORMAL LOW (ref 13.0–17.0)
Immature Granulocytes: 0 %
Lymphocytes Relative: 3 %
Lymphs Abs: 0.3 10*3/uL — ABNORMAL LOW (ref 0.7–4.0)
MCH: 32 pg (ref 26.0–34.0)
MCHC: 32.6 g/dL (ref 30.0–36.0)
MCV: 98.3 fL (ref 80.0–100.0)
Monocytes Absolute: 0.3 10*3/uL (ref 0.1–1.0)
Monocytes Relative: 3 %
Neutro Abs: 12.2 10*3/uL — ABNORMAL HIGH (ref 1.7–7.7)
Neutrophils Relative %: 94 %
Platelets: 162 10*3/uL (ref 150–400)
RBC: 3.5 MIL/uL — ABNORMAL LOW (ref 4.22–5.81)
RDW: 13.5 % (ref 11.5–15.5)
WBC: 12.9 10*3/uL — ABNORMAL HIGH (ref 4.0–10.5)
nRBC: 0 % (ref 0.0–0.2)

## 2020-01-30 LAB — PHOSPHORUS: Phosphorus: 3 mg/dL (ref 2.5–4.6)

## 2020-01-30 LAB — MAGNESIUM: Magnesium: 2.4 mg/dL (ref 1.7–2.4)

## 2020-01-30 MED ORDER — AMOXICILLIN-POT CLAVULANATE 500-125 MG PO TABS
1.0000 | ORAL_TABLET | Freq: Two times a day (BID) | ORAL | 0 refills | Status: DC
Start: 2020-01-30 — End: 2020-02-15

## 2020-01-30 MED ORDER — POLYETHYLENE GLYCOL 3350 17 G PO PACK: 17.0000 g | PACK | Freq: Every day | ORAL | 0 refills | Status: AC | PRN

## 2020-01-30 MED ORDER — PANTOPRAZOLE SODIUM 40 MG PO TBEC
40.0000 mg | DELAYED_RELEASE_TABLET | Freq: Two times a day (BID) | ORAL | Status: DC
  Administered 2020-01-30: 40 mg via ORAL
  Filled 2020-01-30: qty 1

## 2020-01-30 MED ORDER — TRAMADOL HCL 50 MG PO TABS: 50.0000 mg | ORAL_TABLET | Freq: Four times a day (QID) | ORAL | 0 refills | Status: AC | PRN

## 2020-01-30 MED ORDER — BOOST / RESOURCE BREEZE PO LIQD CUSTOM: 1.0000 | Freq: Three times a day (TID) | ORAL | Status: DC

## 2020-01-30 NOTE — Progress Notes (Signed)
[facility] Progress Note  1 Day Post-Op  Subjective: Patient sitting up and feeling well. Denies abdominal pain this AM. Tolerating CLD, passing flatus. Follows soft diet and drinks boost at home. Wife at bedside.   Objective: Vital signs in last 24 hours: Temp:  [97.6 F (36.4 C)-98.3 F (36.8 C)] 98.2 F (36.8 C) (07/26 0409) Pulse Rate:  [62-74] 69 (07/26 0409) Resp:  [10-18] 18 (07/26 0409) BP: (102-125)/(64-83) 125/83 (07/26 0409) SpO2:  [97 %-100 %] 99 % (07/26 0409) Last BM Date: 01/27/20  Intake/Output from previous day: 07/25 0701 - 07/26 0700 In: 3089.8 [P.O.:540; I.V.:2447.2; IV Piggyback:102.5] Out: 3300 [Urine:3250; Blood:50] Intake/Output this shift: No intake/output data recorded.  PE: General: pleasant, WD, WN male who is laying in bed in NAD Heart: regular, rate, and rhythm.  Palpable radial and pedal pulses bilaterally Lungs: CTAB, no wheezes, rhonchi, or rales noted.  Respiratory effort nonlabored Abd: soft, NT, ND, +BS, incisions c/d/i    Lab Results:  Recent Labs    01/29/20 1655 01/30/20 0430  WBC 12.0* 12.9*  HGB 11.3* 11.2*  HCT 34.0* 34.4*  PLT 160 162   BMET Recent Labs    01/29/20 1655 01/30/20 0430  NA 133* 135  K 4.3 4.3  CL 101 103  CO2 24 23  GLUCOSE 123* 101*  BUN 39* 32*  CREATININE 1.91* 1.54*  CALCIUM 7.9* 8.1*   PT/INR No results for input(s): LABPROT, INR in the last 72 hours. CMP     Component Value Date/Time   NA 135 01/30/2020 0430   NA 135 11/09/2019 1711   K 4.3 01/30/2020 0430   CL 103 01/30/2020 0430   CO2 23 01/30/2020 0430   GLUCOSE 101 (H) 01/30/2020 0430   BUN 32 (H) 01/30/2020 0430   BUN 25 11/09/2019 1711   CREATININE 1.54 (H) 01/30/2020 0430   CALCIUM 8.1 (L) 01/30/2020 0430   PROT 6.1 (L) 01/30/2020 0430   PROT 6.4 11/09/2019 1711   ALBUMIN 2.4 (L) 01/30/2020 0430   ALBUMIN 3.9 11/09/2019 1711   AST 73 (H) 01/30/2020 0430   ALT 56 (H) 01/30/2020 0430   ALKPHOS 53 01/30/2020  0430   BILITOT 1.2 01/30/2020 0430   BILITOT 0.5 11/09/2019 1711   GFRNONAA 48 (L) 01/30/2020 0430   GFRAA 56 (L) 01/30/2020 0430   Lipase     Component Value Date/Time   LIPASE 44 01/27/2020 2214       Studies/Results: US Renal Transplant w/Doppler  Result Date: 01/29/2020 CLINICAL DATA:  Acute kidney injury.  Kidney transplant 2000. EXAM: ULTRASOUND OF RENAL TRANSPLANT WITH RENAL DOPPLER ULTRASOUND TECHNIQUE: Ultrasound examination of the renal transplant was performed with gray-scale, color and duplex doppler evaluation. COMPARISON:  CTA from previous day FINDINGS: Transplant kidney location: RLQ Transplant Kidney: Renal measurements: 13.4 x 7.7 x 6.3 = volume: 365mL. Normal in size and parenchymal echogenicity. No evidence of mass or hydronephrosis. No peri-transplant fluid collection seen. Color flow in the main renal artery:  Present Color flow in the main renal vein:  Present Duplex Doppler Evaluation: Main Renal Artery Resistive Index: 0.64 Venous waveform in main renal vein:  Present Intrarenal resistive index in upper pole:  0.62 (normal 0.6-0.8; equivocal 0.8-0.9; abnormal >= 0.9) Intrarenal resistive index in lower pole: 0.65 (normal 0.6-0.8; equivocal 0.8-0.9; abnormal >= 0.9) Bladder: Normal for degree of bladder distention. Other findings:  Ureteral jets not visualized. IMPRESSION: Normal transplant renal vascular Doppler evaluation. No hydronephrosis. Electronically Signed   By: Eden Emms.D.  On: 01/29/2020 15:06   US ABDOMEN LIMITED RUQ  Result Date: 01/28/2020 CLINICAL DATA:  Possible cholecystitis. EXAM: ULTRASOUND ABDOMEN LIMITED RIGHT UPPER QUADRANT COMPARISON:  CT earlier today. FINDINGS: Exam difficult due to patient body habitus. Gallbladder: Moderate cholelithiasis and sludge with largest stone measuring 6 mm. Negative sonographic Murphy sign. Small amount of pericholecystic fluid is present. Edematous gallbladder wall thickening measuring 9 mm. Common bile duct:  Diameter: 3.6 mm. Liver: No focal lesion identified. Within normal limits in parenchymal echogenicity. Portal vein is patent on color Doppler imaging with normal direction of blood flow towards the liver. Other: None. IMPRESSION: Moderate cholelithiasis and sludge as well as moderate edematous gallbladder wall thickening and minimal adjacent free fluid as findings are compatible with acute cholecystitis. Electronically Signed   By: Marin Olp M.D.   On: 01/28/2020 17:03    Anti-infectives: Anti-infectives (From admission, onward)   Start     Dose/Rate Route Frequency Ordered Stop   01/28/20 1200  piperacillin-tazobactam (ZOSYN) IVPB 3.375 g     Discontinue     3.375 g 12.5 mL/hr over 240 Minutes Intravenous Every 8 hours 01/28/20 0901     01/28/20 1100  piperacillin-tazobactam (ZOSYN) IVPB 3.375 g  Status:  Discontinued        3.375 g 100 mL/hr over 30 Minutes Intravenous Every 8 hours 01/28/20 0755 01/28/20 0900   01/28/20 0315  piperacillin-tazobactam (ZOSYN) IVPB 3.375 g        3.375 g 100 mL/hr over 30 Minutes Intravenous  Once 01/28/20 0307 01/28/20 0516       Assessment/Plan HTN Hx of renal transplant 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 Ninfa Linden - POD#1 - tolerating CLD - advance to soft diet and add boost - labs improving, leukocytosis likely in response to surgery - ok to discharge later today from a surgical standpoint if tolerating soft diet and renal clears for discharge too - recommend 5 days PO abx on discharge, will arrange follow up  FEN: soft diet VTE: SCDs, lovenox ID: [id number] 7/24>>  LOS: 2 days    Norm Parcel , [facility] 01/30/2020, 9:13 AM Please see Amion for pager number during day hours 7:00am-4:30pm

## 2020-01-30 NOTE — Progress Notes (Signed)
Admit: 01/27/2020 LOS: 2  22M s/p ddKT 2000 with acute cholecystitis s/p lab chole 7/26  Subjective:  . Lap chole yesterday . Good UOP .  SCr 1.9 --> 1.54; BL Scr 1.0 . Remains on Pred/CyA . Tol PO well this AM  07/25 0701 - 07/26 0700 In: 3089.8 [P.O.:540; I.V.:2447.2; IV Piggyback:102.5] Out: 3300 [Urine:3250; Blood:50]  Filed Weights   01/27/20 2143 01/28/20 0900  Weight: 77.1 kg 77.1 kg    Scheduled Meds: . cycloSPORINE  125 mg Oral QHS  . cycloSPORINE  150 mg Oral Daily  . enoxaparin (LOVENOX) injection  40 mg Subcutaneous Q24H  . feeding supplement  1 Container Oral TID BM  . levothyroxine  50 mcg Oral Q0600  . pantoprazole  40 mg Oral BID  . predniSONE  5 mg Oral Q breakfast  . sodium chloride flush  3 mL Intravenous Q12H   Continuous Infusions: . sodium chloride Stopped (01/28/20 0516)  . sodium chloride 100 mL/hr at 01/30/20 0532  . sodium chloride 1,000 mL (01/29/20 0833)  . piperacillin-tazobactam (ZOSYN)  IV 3.375 g (01/30/20 0534)   PRN Meds:.sodium chloride, albuterol, fentaNYL (SUBLIMAZE) injection, fentaNYL (SUBLIMAZE) injection, ondansetron (ZOFRAN) IV, traMADol  CurrentLabs: reviewed    Physical Exam:  Blood pressure 125/83, pulse 69, temperature 98.2 F (36.8 C), temperature source Oral, resp. rate 18, height 6' (1.829 m), weight 77.1 kg, SpO2 99 %. NAD RRR CtAB S/nt/nd, lap sites c/d No LEE Nonfocal EOMI NCAT  A 1. S/p ddKT BL SCr 1.0.  Pred/CyA; VUR primary/ [facility] and [facility] follow.  2. Acute cholecystitis s/p lap chole 7/25, doing well 3. AKI, resolving, good UOP  P . Resolving AKI, good UOP, tol PO . OK For DC, will arrange f/u labs at [place] this week . Pt w/o questions   Pearson Grippe MD 01/30/2020, 9:56 AM  Recent Labs  Lab 01/29/20 0206 01/29/20 1655 01/30/20 0430  NA 132* 133* 135  K 4.7 4.3 4.3  CL 100 101 103  CO2 23 24 23   GLUCOSE 97 123* 101*  BUN 33* 39* 32*  CREATININE 1.93* 1.91* 1.54*  CALCIUM 8.0* 7.9* 8.1*   PHOS  --   --  3.0   Recent Labs  Lab 01/27/20 2214 01/27/20 2214 01/29/20 0206 01/29/20 1655 01/30/20 0430  WBC 10.0   < > 14.0* 12.0* 12.9*  NEUTROABS 8.4*  --   --  11.5* 12.2*  HGB 12.0*   < > 11.2* 11.3* 11.2*  HCT 35.9*   < > 33.9* 34.0* 34.4*  MCV 95.5   < > 95.5 98.3 98.3  PLT 205   < > 170 160 162   < > = values in this interval not displayed.

## 2020-01-30 NOTE — Discharge Summary (Signed)
Discharge Summary  [name] ZOX:[id number] DOB: January 10, 1961  PCP: Jeffery Pollen, MD  Admit date: 01/27/2020 Discharge date: 01/30/2020  Time spent: 35 minutes   Recommendations for Outpatient Follow-up:  1. Follow up with your renal transplant team within a week 2. Follow up with general surgery 3. Follow up with nephrology 4. Follow up with your PCP 5. Take your medications as prescribed 6. Avoid dehydration 7. Obtain CBC and CMP on Thursday 02/02/20 and follow up with your renal transplant team or your PCP.  Discharge Diagnoses:  Active [facility] Problems   Diagnosis Date Noted  . Acute acalculous cholecystitis 01/28/2020  . Normocytic anemia 01/28/2020  . Hypothyroidism 01/28/2020  . Benign essential HTN 01/28/2020  . Renal transplant recipient 12/15/2017    Resolved [facility] Problems  No resolved problems to display.    Discharge Condition: Stable  Diet recommendation: Low fat diet.  Vitals:   01/30/20 0016 01/30/20 0409  BP: 120/81 125/83  Pulse: 67 69  Resp: 18 18  Temp: 98 F (36.7 C) 98.2 F (36.8 C)  SpO2: 97% 99%    History of present illness:  [name] a 59 y.o.malewith medical history significant ofhypertension, R renal transplant on chronic immunosuppressive therapy, hypothyroidism, and tongue cancer whopresented at [name] as a transfer from [name] with complaints of an acute onset of abdominal pain approximately 1 hour after eating lunch the day prior to his admission. Pain was severe and localized in his right upper abdominal quadrant.  It was associated with nausea and vomiting and worsened with taking a deep breath.  ED Course:  CT scan of the abdomen and pelvis revealed acute cholecystitis with marked gallbladder wall thickening. Attempts were made to transfer to [name], but waiting list was prolonged. Dr. Romana [facility] of General surgeryhere at [name] formally consulted, but recommended  hospitalist admission. [name] called to admit and accepted to a [name] bed.  Ultrasound of right upper quadrant abdomen 01/28/2020 confirmed gallstones and findings consistent with acute calculous cholecystitis.  Decision was made to proceed with lap chole on 01/29/20 by Dr. Evlyn [facility].  No complications. On IV Zosyn on admission and peri surgically.  Switched to Augmentin x 5 days on 01/30/20 due to immunosuppressive state.  [facility] course complicated by acute kidney injury for which nephrology was consulted.  Received IV fluid hydration with improvement. US renal transplant doppler 01/29/20 had a normal examination with no evidence of hydronephrosis.  01/30/20:  Seen and examined with his spouse at bedside.  No acute events overnight.  Feeling quite well and is eager to go home. OK to discharge from nephrology and general surgery standpoint.    [facility] Course:  Principal Problem:   Acute acalculous cholecystitis Active Problems:   Renal transplant recipient   Normocytic anemia   Hypothyroidism   Benign essential HTN  Acute calculous cholecystitis post lap chole on 01/29/20 by Dr. Evlyn [facility]. Presented with acute onset of right upper quadrant abdominal pain of 1 day duration, associated with nausea and vomiting, CT scan suggestive of acute cholecystitis, gallstone confirmed on RUQ abdomen ultrasound with findings compatible with acute calculous cholecystitis. Seen by general surgery, decision was made to proceed with lap chole Completed 3 days of empiric IV antibiotic Zosyn Switched to Augmentin x 5 days on 01/30/20 Continue low fat diet Follow up with general surgery outpatient  Acute transaminitis 2/2 to above Presented with elevated LFTs, trending down post lap chole Ordered to repeat CMP on 02/02/20 outpatient Avoid hepatotoxic agents  AKI likely multifactorial prerenal in the setting of dehydration from nausea, vomiting, poor oral intake, acute illness, IV contrast in the setting  of right renal transplant. Presented with creatinine of 1.01 with GFR greater than 60 Creatinine down-trending, currently 1.54 with GFR on 48 on 01/30/20 from creatinine of 1.91 with GFR of 37 on 01/29/20 Appreciate nephrology's assistance Good urine output, 3.2L recorded in the last 24 hours No evidence of hydronephrosis on ultrasound Avoid nephrotoxic agents, dehydration, or hypotension Repeat chemistry panel on Thursday 02/02/20, order placed.  Post renal transplant on immunosuppressive agents Ultrasound renal transplant with Doppler ordered 01/29/2020, revealed a normal evaluation No evidence of hydronephrosis Continue prednisone 5 mg daily and cyclosporine 150 mg daily and 125 mg nightly Follow up with your renal transplant team at [name].  Leukocytosis secondary to acute calculous cholecystitis with chronic immunosuppression Received 3 days of IV Zosyn for intra-abdominal infection Switched to Augmentin x 5 days on 01/30/20 Repeat CBC on Thursday 02/02/20, order placed. Follow up with General surgery  Chronic steroid use Of prednisone 5 mg daily chronically as an immunosuppressive regimen for renal transplant, defer to Renal transplant team to decide if PPI has a role for GI prophylaxis, while assessing risk of interstitial nephritis. BP has been stable.  Resoled post repletion: Hypomagnesemia Presented with serum magnesium of 1.2 Repleted intravenously, magnesium supplement 4 g Repeat magnesium level 2.4 on 01/30/20.  Resolved: Hypovolemic hyponatremia Presented with serum sodium of 132 Received normal saline IV fluid infusion Repeat serum sodium 135 on 01/30/20  Hypothyroidism Last TSH 3.2 on 11/09/2019 Continue home levothyroxine Follow up with your PCP    Code Status: Full code  Family Communication: Wife at bedside    Consultants:  General surgery  Nephrology  Procedures:  Lap chole on 01/29/2020  Ultrasound renal transplant Doppler  on 01/29/2020  Antimicrobials:  IV Zosyn 01/27/20>>01/30/20  Augmentin 01/30/20 x 5 days   Discharge Exam: BP 125/83 (BP Location: Left Arm)   Pulse 69   Temp 98.2 F (36.8 C) (Oral)   Resp 18   Ht 6' (1.829 m)   Wt 77.1 kg   SpO2 99%   BMI 23.06 kg/m  . General: 59 y.o. year-old male well developed well nourished in no acute distress.  Alert and oriented x3. . Cardiovascular: Regular rate and rhythm with no rubs or gallops.  No thyromegaly or JVD noted.   Marland Kitchen Respiratory: Clear to auscultation with no wheezes or rales. Good inspiratory effort. . Abdomen: Soft nontender nondistended with normal bowel sounds x4 quadrants. . Musculoskeletal: No lower extremity edema. 2/4 pulses in all 4 extremities. Marland Kitchen Psychiatry: Mood is appropriate for condition and setting  Discharge Instructions You were cared for by a hospitalist during your [facility] stay. If you have any questions about your discharge medications or the care you received while you were in the [facility] after you are discharged, you can call the unit and asked to speak with the hospitalist on call if the hospitalist that took care of you is not available. Once you are discharged, your primary care physician will handle any further medical issues. Please note that NO REFILLS for any discharge medications will be authorized once you are discharged, as it is imperative that you return to your primary care physician (or establish a relationship with a primary care physician if you do not have one) for your aftercare needs so that they can reassess your need for medications and monitor your lab values.   Allergies as of  01/30/2020      Reactions   Diflucan [fluconazole] Other (See Comments)   Incompatible with cyclosporine   Contrast Media [iodinated Diagnostic Agents] Rash   Pt tolerates benadryl pre scan without post scan complications    Vancomycin Rash      Medication List    STOP taking these medications   losartan 50 MG  tablet Commonly known as: COZAAR     TAKE these medications   amoxicillin-clavulanate 500-125 MG tablet Commonly known as: Augmentin Take 1 tablet (500 mg total) by mouth 2 (two) times daily with a meal.   cycloSPORINE 100 MG capsule Commonly known as: SANDIMMUNE Take 125-150 mg by mouth See admin instructions. 150 mg in morning and  125  mg at night   levothyroxine 50 MCG tablet Commonly known as: SYNTHROID Take 1 tablet (50 mcg total) by mouth daily.   polyethylene glycol 17 g packet Commonly known as: MiraLax Take 17 g by mouth daily as needed.   predniSONE 5 MG tablet Commonly known as: DELTASONE Take 5 mg by mouth.   traMADol 50 MG tablet Commonly known as: ULTRAM Take 1 tablet (50 mg total) by mouth every 6 (six) hours as needed for moderate pain or severe pain.      Allergies  Allergen Reactions  . Diflucan [Fluconazole] Other (See Comments)    Incompatible with cyclosporine  . Contrast Media [Iodinated Diagnostic Agents] Rash    Pt tolerates benadryl pre scan without post scan complications   . Vancomycin Rash    Follow-up Information    Coralie Keens, MD Follow up.   Specialty: General Surgery Why: Our office is scheduling an appointment to follow up in about 3 weeks. Please arrive 30 min prior to appointment time. Bring photo ID and insurance information.  Contact information: [address] [place] [address] [phone]        Jeffery Pollen, MD. Call in 1 day(s).   Specialty: Internal Medicine Why: Please call for a post [facility] follow up appointment Contact information: [place] [address] [phone]                The results of significant diagnostics from this hospitalization (including imaging, microbiology, ancillary and laboratory) are listed below for reference.    Significant Diagnostic Studies: US Renal Transplant w/Doppler  Result Date: 01/29/2020 CLINICAL DATA:  Acute kidney  injury.  Kidney transplant 2000. EXAM: ULTRASOUND OF RENAL TRANSPLANT WITH RENAL DOPPLER ULTRASOUND TECHNIQUE: Ultrasound examination of the renal transplant was performed with gray-scale, color and duplex doppler evaluation. COMPARISON:  CTA from previous day FINDINGS: Transplant kidney location: RLQ Transplant Kidney: Renal measurements: 13.4 x 7.7 x 6.3 = volume: 358mL. Normal in size and parenchymal echogenicity. No evidence of mass or hydronephrosis. No peri-transplant fluid collection seen. Color flow in the main renal artery:  Present Color flow in the main renal vein:  Present Duplex Doppler Evaluation: Main Renal Artery Resistive Index: 0.64 Venous waveform in main renal vein:  Present Intrarenal resistive index in upper pole:  0.62 (normal 0.6-0.8; equivocal 0.8-0.9; abnormal >= 0.9) Intrarenal resistive index in lower pole: 0.65 (normal 0.6-0.8; equivocal 0.8-0.9; abnormal >= 0.9) Bladder: Normal for degree of bladder distention. Other findings:  Ureteral jets not visualized. IMPRESSION: Normal transplant renal vascular Doppler evaluation. No hydronephrosis. Electronically Signed   By: Lucrezia Europe M.D.   On: 01/29/2020 15:06   CT Angio Abd/Pel W and/or Wo Contrast  Result Date: 01/28/2020 CLINICAL DATA:  Epigastric pain,  vomiting, history of renal transplant EXAM: CTA ABDOMEN AND PELVIS WITHOUT AND WITH CONTRAST TECHNIQUE: Multidetector CT imaging of the abdomen and pelvis was performed using the standard protocol during bolus administration of intravenous contrast. Multiplanar reconstructed images and MIPs were obtained and reviewed to evaluate the vascular anatomy. CONTRAST:  158mL OMNIPAQUE IOHEXOL 350 MG/ML SOLN COMPARISON:  04/30/2015 FINDINGS: VASCULAR Aorta: Normal caliber aorta without aneurysm, dissection, vasculitis or significant stenosis. Celiac: Patent without evidence of aneurysm, dissection, vasculitis or significant stenosis. SMA: Patent without evidence of aneurysm, dissection,  vasculitis or significant stenosis. Renals: Native renal arteries are markedly atretic, consistent with end-stage renal disease. Transplant renal artery off the right external iliac artery is widely patent. IMA: Patent without evidence of aneurysm, dissection, vasculitis or significant stenosis. Inflow: Patent without evidence of aneurysm, dissection, vasculitis or significant stenosis. Proximal Outflow: Bilateral common femoral and visualized portions of the superficial and profunda femoral arteries are patent without evidence of aneurysm, dissection, vasculitis or significant stenosis. Veins: No obvious venous abnormality within the limitations of this arterial phase study. Review of the MIP images confirms the above findings. NON-VASCULAR Lower chest: No acute pleural or parenchymal lung disease. Hepatobiliary: Gallbladder is markedly distended with gallbladder wall thickening, intramural edema, and significant pericholecystic fat stranding consistent with acute cholecystitis. No calcified gallstones. There are no focal liver abnormalities. Pancreas: Unremarkable. No pancreatic ductal dilatation or surrounding inflammatory changes. Spleen: Normal in size without focal abnormality. Adrenals/Urinary Tract: The native kidneys are markedly atrophic with cortical calcifications consistent with end-stage renal disease. Transplant kidney in the right lower quadrant enhances normally, with no focal abnormality. Bladder is moderately distended with no gross abnormalities. Evaluation is limited due to streak artifact from bilateral hip arthroplasties. Stomach/Bowel: No bowel obstruction or ileus. No wall thickening or inflammatory change. Lymphatic: No pathologic adenopathy. Reproductive: Prostate is unremarkable. Other: Trace free fluid in the right upper quadrant associated with acute cholecystitis. No free intraperitoneal gas. Musculoskeletal: Bilateral hip arthroplasties. No acute or destructive bony lesions.  IMPRESSION: 1. Acute cholecystitis, with marked gallbladder wall thickening, pericholecystic fluid, and gallbladder distension. No calcified gallstones. 2. Mild atherosclerosis throughout the aorta, with no significant vascular abnormality. 3. Unremarkable right lower quadrant renal transplant. Electronically Signed   By: Randa Ngo M.D.   On: 01/28/2020 02:44   US ABDOMEN LIMITED RUQ  Result Date: 01/28/2020 CLINICAL DATA:  Possible cholecystitis. EXAM: ULTRASOUND ABDOMEN LIMITED RIGHT UPPER QUADRANT COMPARISON:  CT earlier today. FINDINGS: Exam difficult due to patient body habitus. Gallbladder: Moderate cholelithiasis and sludge with largest stone measuring 6 mm. Negative sonographic Murphy sign. Small amount of pericholecystic fluid is present. Edematous gallbladder wall thickening measuring 9 mm. Common bile duct: Diameter: 3.6 mm. Liver: No focal lesion identified. Within normal limits in parenchymal echogenicity. Portal vein is patent on color Doppler imaging with normal direction of blood flow towards the liver. Other: None. IMPRESSION: Moderate cholelithiasis and sludge as well as moderate edematous gallbladder wall thickening and minimal adjacent free fluid as findings are compatible with acute cholecystitis. Electronically Signed   By: Marin Olp M.D.   On: 01/28/2020 17:03    Microbiology: Recent Results (from the past 240 hour(s))  SARS Coronavirus 2 by RT PCR ([facility] order, performed in [facility] lab) Nasopharyngeal Nasopharyngeal Swab     Status: None   Collection Time: 01/28/20  3:55 AM   Specimen: Nasopharyngeal Swab  Result Value Ref Range Status   SARS Coronavirus 2 NEGATIVE NEGATIVE Final    Comment: (NOTE) SARS-CoV-2 target nucleic  acids are NOT DETECTED.  The SARS-CoV-2 RNA is generally detectable in upper and lower respiratory specimens during the acute phase of infection. The lowest concentration of SARS-CoV-2 viral copies this assay can detect is  250 copies / mL. A negative result does not preclude SARS-CoV-2 infection and should not be used as the sole basis for treatment or other patient management decisions.  A negative result may occur with improper specimen collection / handling, submission of specimen other than nasopharyngeal swab, presence of viral mutation(s) within the areas targeted by this assay, and inadequate number of viral copies (<250 copies / mL). A negative result must be combined with clinical observations, patient history, and epidemiological information.  Fact Sheet for Patients:   StrictlyIdeas.no  Fact Sheet for Healthcare Providers: BankingDealers.co.za  This test is not yet approved or  cleared by the [place] FDA and has been authorized for detection and/or diagnosis of SARS-CoV-2 by FDA under an Emergency Use Authorization (EUA).  This EUA will remain in effect (meaning this test can be used) for the duration of the COVID-19 declaration under Section 564(b)(1) of the Act, 21 U.S.C. section 360bbb-3(b)(1), unless the authorization is terminated or revoked sooner.  Performed at [facility], [address] [place], [place] [address]      Labs: Basic Metabolic Panel: Recent Labs  Lab 01/27/20 2214 01/28/20 1444 01/29/20 0206 01/29/20 1655 01/30/20 0430  NA 132* 133* 132* 133* 135  K 3.8 4.8 4.7 4.3 4.3  CL 97* 98 100 101 103  CO2 24 24 23 24 23   GLUCOSE 137* 77 97 123* 101*  BUN 24* 24* 33* 39* 32*  CREATININE 1.01 1.46* 1.93* 1.91* 1.54*  CALCIUM 8.9 8.6* 8.0* 7.9* 8.1*  MG  --  1.2*  --   --  2.4  PHOS  --   --   --   --  3.0   Liver Function Tests: Recent Labs  Lab 01/27/20 2214 01/28/20 1444 01/29/20 1655 01/30/20 0430  AST 27 37 97* 73*  ALT 28 31 63* 56*  ALKPHOS 56 49 45 53  BILITOT 0.9 1.7* 1.3* 1.2  PROT 6.8 6.1* 5.6* 6.1*  ALBUMIN 3.7 3.0* 2.3* 2.4*   Recent Labs  Lab 01/27/20 2214  LIPASE 44    No results for input(s): AMMONIA in the last 168 hours. CBC: Recent Labs  Lab 01/27/20 2214 01/29/20 0206 01/29/20 1655 01/30/20 0430  WBC 10.0 14.0* 12.0* 12.9*  NEUTROABS 8.4*  --  11.5* 12.2*  HGB 12.0* 11.2* 11.3* 11.2*  HCT 35.9* 33.9* 34.0* 34.4*  MCV 95.5 95.5 98.3 98.3  PLT 205 170 160 162   Cardiac Enzymes: No results for input(s): CKTOTAL, CKMB, CKMBINDEX, TROPONINI in the last 168 hours. BNP: BNP (last 3 results) No results for input(s): BNP in the last 8760 hours.  ProBNP (last 3 results) No results for input(s): PROBNP in the last 8760 hours.  CBG: No results for input(s): GLUCAP in the last 168 hours.     Signed:  Kayleen Memos, MD [facility] 01/30/2020, 11:18 AM

## 2020-01-30 NOTE — Discharge Instructions (Signed)
Cholecystitis  Cholecystitis is irritation and swelling (inflammation) of the gallbladder. The gallbladder is an organ that is shaped like a pear. It is under the liver on the right side of the body. This organ stores bile. Bile helps the body break down (digest) the fats in food. This condition can occur all of a sudden. It needs to be treated. What are the causes? This condition may be caused by stones or lumps that form in the gallbladder (gallstones). Gallstones can block the tube (duct) that carries bile out of your gallbladder. Other causes are:  Damage to the gallbladder due to less blood flow.  Germs in the bile ducts.  Scars or kinks in the bile ducts.  Abnormal growths (tumors) in the liver, pancreas, or gallbladder. What increases the risk? You are more likely to develop this condition if:  You have sickle cell disease.  You take birth control pills.  You use estrogen.  You have alcoholic liver disease.  You have liver cirrhosis.  You are being fed through a vein.  You are very ill.  You do not eat or drink for a long time. This is also called "fasting."  You are overweight (obese).  You lose weight too fast.  You are pregnant.  You have high levels of fat in the blood (triglycerides).  You have irritation and swelling of the pancreas (pancreatitis). What are the signs or symptoms? Symptoms of this condition include:  Pain in the belly (abdomen). Pain is often in the upper right area of the belly.  Tenderness or bloating in the belly.  Feeling sick to your stomach (nauseous).  Throwing up (vomiting).  Fever.  Chills. How is this diagnosed? This condition may be diagnosed with a medical history and exam. You may also have other tests, such as:  Imaging tests. This may include: ? Ultrasound. ? CT scan of the belly. ? Nuclear scan. This is also called a HIDA scan. This scan lets your doctor see the bile as it moves in your  body. ? MRI.  Blood tests. These are done to check: ? Your blood count. The white blood cell count may be higher than normal. ? How well your liver works. How is this treated? This condition may be treated with:  Surgery to take out your gallbladder.  Antibiotic medicines to treat illnesses caused by germs.  Going without food for some time.  Giving fluids through an IV tube.  Medicines to treat pain or throwing up. Follow these instructions at home:  If you had surgery, follow instructions from your doctor about how to care for yourself after you go home. Medicines   Take over-the-counter and prescription medicines only as told by your doctor.  If you were prescribed an antibiotic medicine, take it as told by your doctor. Do not stop taking it even if you start to feel better. General instructions  Follow instructions from your doctor about what to eat or drink. Do not eat or drink anything that makes you sick again.  Do not lift anything that is heavier than 10 lb (4.5 kg) until your doctor says that it is safe.  Do not use any products that contain nicotine or tobacco, such as cigarettes and e-cigarettes. If you need help quitting, ask your doctor.  Keep all follow-up visits as told by your doctor. This is important. Contact a doctor if:  You have pain and your medicine does not help.  You have a fever. Get help right away if:  Your pain moves to: ? Another part of your belly. ? Your back.  Your symptoms do not go away.  You have new symptoms. Summary  Cholecystitis is swelling and irritation of the gallbladder.  This condition may be caused by stones or lumps that form in the gallbladder (gallstones).  Common symptoms are pain in the belly. You may feel sick to your stomach and start throwing up. You may also have a fever and chills.  This condition may be treated with surgery to take out the gallbladder. It may also be treated with medicines, fasting,  and fluids through an IV tube.  Follow what you are told about eating and drinking. Do not eat things that make you sick again. This information is not intended to replace advice given to you by your health care provider. Make sure you discuss any questions you have with your health care provider. Document Revised: 10/30/2017 Document Reviewed: 10/30/2017 Elsevier Patient Education  [place] and Cholesterol Restricted Eating Plan Getting too much fat and cholesterol in your diet may cause health problems. Choosing the right foods helps keep your fat and cholesterol at normal levels. This can keep you from getting certain diseases. Your doctor may recommend an eating plan that includes:  Total fat: ______% or less of total calories a day.  Saturated fat: ______% or less of total calories a day.  Cholesterol: less than _________mg a day.  Fiber: ______g a day. What are tips for following this plan? Meal planning  At meals, divide your plate into four equal parts: ? Fill one-half of your plate with vegetables and green salads. ? Fill one-fourth of your plate with whole grains. ? Fill one-fourth of your plate with low-fat (lean) protein foods.  Eat fish that is high in omega-3 fats at least two times a week. This includes mackerel, tuna, sardines, and salmon.  Eat foods that are high in fiber, such as whole grains, beans, apples, broccoli, carrots, peas, and barley. General tips   Work with your doctor to lose weight if you need to.  Avoid: ? Foods with added sugar. ? Fried foods. ? Foods with partially hydrogenated oils.  Limit alcohol intake to no more than 1 drink a day for nonpregnant women and 2 drinks a day for men. One drink equals 12 oz of beer, 5 oz of wine, or 1 oz of hard liquor. Reading food labels  Check food labels for: ? Trans fats. ? Partially hydrogenated oils. ? Saturated fat (g) in each serving. ? Cholesterol (mg) in each serving. ? Fiber  (g) in each serving.  Choose foods with healthy fats, such as: ? Monounsaturated fats. ? Polyunsaturated fats. ? Omega-3 fats.  Choose grain products that have whole grains. Look for the word "whole" as the first word in the ingredient list. Cooking  Cook foods using low-fat methods. These include baking, boiling, grilling, and broiling.  Eat more home-cooked foods. Eat at restaurants and buffets less often.  Avoid cooking using saturated fats, such as butter, cream, palm oil, palm kernel oil, and coconut oil. Recommended foods  Fruits  All fresh, canned (in natural juice), or frozen fruits. Vegetables  Fresh or frozen vegetables (raw, steamed, roasted, or grilled). Green salads. Grains  Whole grains, such as whole wheat or whole grain breads, crackers, cereals, and pasta. Unsweetened oatmeal, bulgur, barley, quinoa, or brown rice. Corn or whole wheat flour tortillas. Meats and other protein foods  Ground beef (85% or leaner), grass-fed beef, or beef  trimmed of fat. Skinless chicken or [place]. Ground chicken or [place]. Pork trimmed of fat. All fish and seafood. Egg whites. Dried beans, peas, or lentils. Unsalted nuts or seeds. Unsalted canned beans. Nut butters without added sugar or oil. Dairy  Low-fat or nonfat dairy products, such as skim or 1% milk, 2% or reduced-fat cheeses, low-fat and fat-free ricotta or cottage cheese, or plain low-fat and nonfat yogurt. Fats and oils  Tub margarine without trans fats. Light or reduced-fat mayonnaise and salad dressings. Avocado. Olive, canola, sesame, or safflower oils. The items listed above may not be a complete list of foods and beverages you can eat. Contact a dietitian for more information. Foods to avoid Fruits  Canned fruit in heavy syrup. Fruit in cream or butter sauce. Fried fruit. Vegetables  Vegetables cooked in cheese, cream, or butter sauce. Fried vegetables. Grains  White bread. White pasta. White rice. Cornbread.  Bagels, pastries, and croissants. Crackers and snack foods that contain trans fat and hydrogenated oils. Meats and other protein foods  Fatty cuts of meat. Ribs, chicken wings, bacon, sausage, bologna, salami, chitterlings, fatback, hot dogs, bratwurst, and packaged lunch meats. Liver and organ meats. Whole eggs and egg yolks. Chicken and [place] with skin. Fried meat. Dairy  Whole or 2% milk, cream, half-and-half, and cream cheese. Whole milk cheeses. Whole-fat or sweetened yogurt. Full-fat cheeses. Nondairy creamers and whipped toppings. Processed cheese, cheese spreads, and cheese curds. Beverages  Alcohol. Sugar-sweetened drinks such as sodas, lemonade, and fruit drinks. Fats and oils  Butter, stick margarine, lard, shortening, ghee, or bacon fat. Coconut, palm kernel, and palm oils. Sweets and desserts  Corn syrup, sugars, honey, and molasses. Candy. Jam and jelly. Syrup. Sweetened cereals. Cookies, pies, cakes, donuts, muffins, and ice cream. The items listed above may not be a complete list of foods and beverages you should avoid. Contact a dietitian for more information. Summary  Choosing the right foods helps keep your fat and cholesterol at normal levels. This can keep you from getting certain diseases.  At meals, fill one-half of your plate with vegetables and green salads.  Eat high-fiber foods, like whole grains, beans, apples, carrots, peas, and barley.  Limit added sugar, saturated fats, alcohol, and fried foods. This information is not intended to replace advice given to you by your health care provider. Make sure you discuss any questions you have with your health care provider. Document Revised: 02/24/2018 Document Reviewed: 03/10/2017 Elsevier Patient Education  [place] If you have a gallbladder condition, you may have trouble digesting fats. Eating a low-fat diet can help reduce your symptoms, and may be helpful before and after  having surgery to remove your gallbladder (cholecystectomy). Your health care provider may recommend that you work with a diet and nutrition specialist (dietitian) to help you reduce the amount of fat in your diet. What are tips for following this plan? General guidelines  Limit your fat intake to less than 30% of your total daily calories. If you eat around 1,800 calories each day, this is less than 60 grams (g) of fat per day.  Fat is an important part of a healthy diet. Eating a low-fat diet can make it hard to maintain a healthy body weight. Ask your dietitian how much fat, calories, and other nutrients you need each day.  Eat small, frequent meals throughout the day instead of three large meals.  Drink at least 8-10 cups of fluid a day. Drink enough fluid to keep your  urine clear or pale yellow.  Limit alcohol intake to no more than 1 drink a day for nonpregnant women and 2 drinks a day for men. One drink equals 12 oz of beer, 5 oz of wine, or 1 oz of hard liquor. Reading food labels  Check Nutrition Facts on food labels for the amount of fat per serving. Choose foods with less than 3 grams of fat per serving. Shopping  Choose nonfat and low-fat healthy foods. Look for the words "nonfat," "low fat," or "fat free."  Avoid buying processed or prepackaged foods. Cooking  Cook using low-fat methods, such as baking, broiling, grilling, or boiling.  Cook with small amounts of healthy fats, such as olive oil, grapeseed oil, canola oil, or sunflower oil. What foods are recommended?   All fresh, frozen, or canned fruits and vegetables.  Whole grains.  Low-fat or non-fat (skim) milk and yogurt.  Lean meat, skinless poultry, fish, eggs, and beans.  Low-fat protein supplement powders or drinks.  Spices and herbs. What foods are not recommended?  High-fat foods. These include baked goods, fast food, fatty cuts of meat, ice cream, french toast, sweet rolls, pizza, cheese bread,  foods covered with butter, creamy sauces, or cheese.  Fried foods. These include french fries, tempura, battered fish, breaded chicken, fried breads, and sweets.  Foods with strong odors.  Foods that cause bloating and gas. Summary  A low-fat diet can be helpful if you have a gallbladder condition, or before and after gallbladder surgery.  Limit your fat intake to less than 30% of your total daily calories. This is about 60 g of fat if you eat 1,800 calories each day.  Eat small, frequent meals throughout the day instead of three large meals. This information is not intended to replace advice given to you by your health care provider. Make sure you discuss any questions you have with your health care provider. Document Revised: 10/14/2018 Document Reviewed: 07/31/2016 Elsevier Patient Education  2020 [employer] LAPAROSCOPIC SURGERY: POST OP INSTRUCTIONS Always review your discharge instruction sheet given to you by the facility where your surgery was performed. IF YOU HAVE DISABILITY OR FAMILY LEAVE FORMS, YOU MUST BRING THEM TO THE OFFICE FOR PROCESSING.   DO NOT GIVE THEM TO YOUR DOCTOR.  PAIN CONTROL  1. First take acetaminophen (Tylenol) AND/or ibuprofen (Advil) to control your pain after surgery.  Follow directions on package.  Taking acetaminophen (Tylenol) and/or ibuprofen (Advil) regularly after surgery will help to control your pain and lower the amount of prescription pain medication you may need.  You should not take more than 3,000 mg (3 grams) of acetaminophen (Tylenol) in 24 hours.  You should not take ibuprofen (Advil), aleve, motrin, naprosyn or other NSAIDS if you have a history of stomach ulcers or chronic kidney disease.  2. A prescription for pain medication may be given to you upon discharge.  Take your pain medication as prescribed, if you still have uncontrolled pain after taking acetaminophen (Tylenol) or ibuprofen  (Advil). 3. Use ice packs to help control pain. 4. If you need a refill on your pain medication, please contact your pharmacy.  They will contact our office to request authorization. Prescriptions will not be filled after 5pm or on week-ends.  HOME MEDICATIONS 5. Take your usually prescribed medications unless otherwise directed.  DIET 6. You should follow a light diet the first few days after arrival home.  Be sure to include lots of fluids daily. Avoid  fatty, fried foods.   CONSTIPATION 7. It is common to experience some constipation after surgery and if you are taking pain medication.  Increasing fluid intake and taking a stool softener (such as Colace) will usually help or prevent this problem from occurring.  A mild laxative (Milk of Magnesia or Miralax) should be taken according to package instructions if there are no bowel movements after 48 hours.  WOUND/INCISION CARE 8. Most patients will experience some swelling and bruising in the area of the incisions.  Ice packs will help.  Swelling and bruising can take several days to resolve.  9. Unless discharge instructions indicate otherwise, follow guidelines below  a. STERI-STRIPS - you may remove your outer bandages 48 hours after surgery, and you may shower at that time.  You have steri-strips (small skin tapes) in place directly over the incision.  These strips should be left on the skin for 7-10 days.   b. DERMABOND/SKIN GLUE - you may shower in 24 hours.  The glue will flake off over the next 2-3 weeks. 10. Any sutures or staples will be removed at the office during your follow-up visit.  ACTIVITIES 11. You may resume regular (light) daily activities beginning the next day--such as daily self-care, walking, climbing stairs--gradually increasing activities as tolerated.  You may have sexual intercourse when it is comfortable.  Refrain from any heavy lifting or straining until approved by your doctor. a. You may drive when you are no  longer taking prescription pain medication, you can comfortably wear a seatbelt, and you can safely maneuver your car and apply brakes.  FOLLOW-UP 12. You should see your doctor in the office for a follow-up appointment approximately 2-3 weeks after your surgery.  You should have been given your post-op/follow-up appointment when your surgery was scheduled.  If you did not receive a post-op/follow-up appointment, make sure that you call for this appointment within a day or two after you arrive home to insure a convenient appointment time.   WHEN TO CALL YOUR DOCTOR: 1. Fever over 101.0 2. Inability to urinate 3. Continued bleeding from incision. 4. Increased pain, redness, or drainage from the incision. 5. Increasing abdominal pain  The clinic staff is available to answer your questions during regular business hours.  Please don't hesitate to call and ask to speak to one of the nurses for clinical concerns.  If you have a medical emergency, go to the nearest emergency room or call 911.  A surgeon from [facility] is always on call at the hospital. [address], [place], [place], [place]  [address] ? [address], [place], [place]   [address] [phone] ? [phone] ? FAX [phone] Web site: [email or web address]

## 2020-01-31 LAB — SURGICAL PATHOLOGY

## 2020-01-31 LAB — CYCLOSPORINE: Cyclosporine, LabCorp: 286 ng/mL (ref 100–400)

## 2020-02-15 ENCOUNTER — Encounter: Payer: Self-pay | Admitting: Emergency Medicine

## 2020-02-15 ENCOUNTER — Other Ambulatory Visit: Payer: Self-pay

## 2020-02-15 ENCOUNTER — Ambulatory Visit: Payer: 59 | Admitting: Emergency Medicine

## 2020-02-15 VITALS — BP 117/69 | HR 70 | Temp 98.1°F | Ht 72.0 in | Wt 167.0 lb

## 2020-02-15 DIAGNOSIS — Z9049 Acquired absence of other specified parts of digestive tract: Secondary | ICD-10-CM | POA: Diagnosis not present

## 2020-02-15 DIAGNOSIS — D649 Anemia, unspecified: Secondary | ICD-10-CM

## 2020-02-15 DIAGNOSIS — Z940 Kidney transplant status: Secondary | ICD-10-CM

## 2020-02-15 DIAGNOSIS — D84821 Immunodeficiency due to drugs: Secondary | ICD-10-CM | POA: Diagnosis not present

## 2020-02-15 DIAGNOSIS — I10 Essential (primary) hypertension: Secondary | ICD-10-CM

## 2020-02-15 DIAGNOSIS — Z09 Encounter for follow-up examination after completed treatment for conditions other than malignant neoplasm: Secondary | ICD-10-CM

## 2020-02-15 DIAGNOSIS — E890 Postprocedural hypothyroidism: Secondary | ICD-10-CM | POA: Diagnosis not present

## 2020-02-15 DIAGNOSIS — Z79899 Other long term (current) drug therapy: Secondary | ICD-10-CM

## 2020-02-15 NOTE — Patient Instructions (Addendum)
   If you have lab work done today you will be contacted with your lab results within the next 2 weeks.  If you have not heard from us then please contact us. The fastest way to get your results is to register for My Chart.   IF you received an x-ray today, you will receive an invoice from [facility]. Please contact [facility] at [phone] with questions or concerns regarding your invoice.   IF you received labwork today, you will receive an invoice from [facility]. Please contact [facility] at [phone] with questions or concerns regarding your invoice.   Our billing staff will not be able to assist you with questions regarding bills from these companies.  You will be contacted with the lab results as soon as they are available. The fastest way to get your results is to activate your My Chart account. Instructions are located on the last page of this paperwork. If you have not heard from us regarding the results in 2 weeks, please contact this office.      Health Maintenance, Male Adopting a healthy lifestyle and getting preventive care are important in promoting health and wellness. Ask your health care provider about:  The right schedule for you to have regular tests and exams.  Things you can do on your own to prevent diseases and keep yourself healthy. What should I know about diet, weight, and exercise? Eat a healthy diet   Eat a diet that includes plenty of vegetables, fruits, low-fat dairy products, and lean protein.  Do not eat a lot of foods that are high in solid fats, added sugars, or sodium. Maintain a healthy weight Body mass index (BMI) is a measurement that can be used to identify possible weight problems. It estimates body fat based on height and weight. Your health care provider can help determine your BMI and help you achieve or maintain a healthy weight. Get regular exercise Get regular exercise. This is one of the most important things you  can do for your health. Most adults should:  Exercise for at least 150 minutes each week. The exercise should increase your heart rate and make you sweat (moderate-intensity exercise).  Do strengthening exercises at least twice a week. This is in addition to the moderate-intensity exercise.  Spend less time sitting. Even light physical activity can be beneficial. Watch cholesterol and blood lipids Have your blood tested for lipids and cholesterol at 61 years of age, then have this test every 5 years. You may need to have your cholesterol levels checked more often if:  Your lipid or cholesterol levels are high.  You are older than 61 years of age.  You are at high risk for heart disease. What should I know about cancer screening? Many types of cancers can be detected early and may often be prevented. Depending on your health history and family history, you may need to have cancer screening at various ages. This may include screening for:  Colorectal cancer.  Prostate cancer.  Skin cancer.  Lung cancer. What should I know about heart disease, diabetes, and high blood pressure? Blood pressure and heart disease  High blood pressure causes heart disease and increases the risk of stroke. This is more likely to develop in people who have high blood pressure readings, are of African descent, or are overweight.  Talk with your health care provider about your target blood pressure readings.  Have your blood pressure checked: ? Every 3-5 years if you are 18-39   years of age. ? Every year if you are 40 years old or older.  If you are between the ages of 65 and 75 and are a current or former smoker, ask your health care provider if you should have a one-time screening for abdominal aortic aneurysm (AAA). Diabetes Have regular diabetes screenings. This checks your fasting blood sugar level. Have the screening done:  Once every three years after age 45 if you are at a normal weight and have  a low risk for diabetes.  More often and at a younger age if you are overweight or have a high risk for diabetes. What should I know about preventing infection? Hepatitis B If you have a higher risk for hepatitis B, you should be screened for this virus. Talk with your health care provider to find out if you are at risk for hepatitis B infection. Hepatitis C Blood testing is recommended for:  Everyone born from 1945 through 1965.  Anyone with known risk factors for hepatitis C. Sexually transmitted infections (STIs)  You should be screened each year for STIs, including gonorrhea and chlamydia, if: ? You are sexually active and are younger than 61 years of age. ? You are older than 61 years of age and your health care provider tells you that you are at risk for this type of infection. ? Your sexual activity has changed since you were last screened, and you are at increased risk for chlamydia or gonorrhea. Ask your health care provider if you are at risk.  Ask your health care provider about whether you are at high risk for HIV. Your health care provider may recommend a prescription medicine to help prevent HIV infection. If you choose to take medicine to prevent HIV, you should first get tested for HIV. You should then be tested every 3 months for as long as you are taking the medicine. Follow these instructions at home: Lifestyle  Do not use any products that contain nicotine or tobacco, such as cigarettes, e-cigarettes, and chewing tobacco. If you need help quitting, ask your health care provider.  Do not use street drugs.  Do not share needles.  Ask your health care provider for help if you need support or information about quitting drugs. Alcohol use  Do not drink alcohol if your health care provider tells you not to drink.  If you drink alcohol: ? Limit how much you have to 0-2 drinks a day. ? Be aware of how much alcohol is in your drink. In the [place] one drink equals one 12  oz bottle of beer (355 mL), one 5 oz glass of wine (148 mL), or one 1 oz glass of hard liquor (44 mL). General instructions  Schedule regular health, dental, and eye exams.  Stay current with your vaccines.  Tell your health care provider if: ? You often feel depressed. ? You have ever been abused or do not feel safe at home. Summary  Adopting a healthy lifestyle and getting preventive care are important in promoting health and wellness.  Follow your health care provider's instructions about healthy diet, exercising, and getting tested or screened for diseases.  Follow your health care provider's instructions on monitoring your cholesterol and blood pressure. This information is not intended to replace advice given to you by your health care provider. Make sure you discuss any questions you have with your health care provider. Document Revised: 06/16/2018 Document Reviewed: 06/16/2018 Elsevier Patient Education  2020 [employer]  

## 2020-02-15 NOTE — Progress Notes (Signed)
[name] 59 y.o. Discharge Summary  [name] DJT:[id number] DOB: 1961-01-12  PCP: Horald Pollen, MD  Admit date: 01/27/2020 Discharge date: 01/30/2020  Time spent: 35 minutes   Recommendations for Outpatient Follow-up:  1. Follow up with your renal transplant team within a week 2. Follow up with general surgery 3. Follow up with nephrology 4. Follow up with your PCP 5. Take your medications as prescribed 6. Avoid dehydration 7. Obtain CBC and CMP on Thursday 02/02/20 and follow up with your renal transplant team or your PCP.  Discharge Diagnoses:      Active Hospital Problems   Diagnosis Date Noted  . Acute acalculous cholecystitis 01/28/2020  . Normocytic anemia 01/28/2020  . Hypothyroidism 01/28/2020  . Benign essential HTN 01/28/2020  . Renal transplant recipient 12/15/2017    Resolved Hospital Problems  No resolved problems to display.    Discharge Condition: Stable  Diet recommendation: Low fat diet.  Hospital Course:  Principal Problem:   Acute acalculous cholecystitis Active Problems:   Renal transplant recipient   Normocytic anemia   Hypothyroidism   Benign essential HTN  Acutecalculouscholecystitis post lap chole on 01/29/20 by Dr. Evlyn Courier. Presented with acute onset ofright upper quadrant abdominal pain of 1 day duration,associated with nausea and vomiting,CT scan suggestive of acute cholecystitis, gallstone confirmed on RUQabdomen ultrasound with findings compatible with acute calculous cholecystitis. Seen by general surgery, decision was made to proceed with lap chole Completed 3 days of empiric IV antibiotic Zosyn Switched to Augmentin x 5 days on 01/30/20 Continue low fat diet Follow up with general surgery outpatient  Acute transaminitis 2/2 to above Presented with elevated LFTs, trending down post lap chole Ordered to repeat CMP on 02/02/20 outpatient Avoid hepatotoxic agents  AKI likely multifactorial  prerenal in the setting of dehydration from nausea,vomiting,poor oral intake,acute illness, IV contrast in the setting of right renal transplant. Presented with creatinine of 1.01 with GFR greater than 60 Creatinine down-trending,currently 1.54 with GFR on 48 on 01/30/20 from creatinine of 1.91 with GFR of 37 on 01/29/20 Appreciate nephrology's assistance Good urine output, 3.2L recorded in the last 24 hours No evidence of hydronephrosis on ultrasound Avoid nephrotoxic agents, dehydration, or hypotension Repeat chemistry panel on Thursday 02/02/20, order placed.  Post renal transplant on immunosuppressiveagents Ultrasound renal transplant with Doppler ordered 01/29/2020, revealed a normal evaluation No evidence of hydronephrosis Continueprednisone 5 mg daily and cyclosporine 150 mg daily and 125 mg nightly Follow up with your renal transplant team at [facility].  Leukocytosis secondary to acute calculous cholecystitiswithchronic immunosuppression Received 3 days of IV Zosyn for intra-abdominal infection Switched to Augmentin x 5 days on 01/30/20 Repeat CBC on Thursday 02/02/20, order placed. Follow up with General surgery  Chronic steroid use Of prednisone 5 mg daily chronicallyas an immunosuppressive regimen for renal transplant, defer to Renal transplant team to decide if PPI has a role for GI prophylaxis, while assessing risk of interstitial nephritis. BP has been stable.  Resoled post repletion: Hypomagnesemia Presentedwith serum magnesium of 1.2 Repleted intravenously, magnesium supplement 4 g Repeat magnesium level 2.4 on 01/30/20.  Resolved: Hypovolemic hyponatremia Presented with serum sodium of 132 Received normal saline IV fluid infusion Repeat serum sodium 135 on 01/30/20  Hypothyroidism Last TSH 3.2 on 11/09/2019 Continue home levothyroxine Follow up with your PCP    Chief Complaint  Patient presents with  . Hospitalization Follow-up    post  op gallbladder surgery     HISTORY OF PRESENT ILLNESS: This is a 59  y.o. male here for follow-up of hospital discharge, admitted on 01/27/2020 with a diagnosis of acute cholecystitis and discharged on 01/30/2020.  No complications.  Did well.  Has appointment to follow-up with general surgeon and nephrologist next week. Today has no complaints or medical concerns.  Doing well.  Fully vaccinated against Covid.   HPI   Prior to Admission medications   Medication Sig Start Date End Date Taking? Authorizing Provider  cycloSPORINE (SANDIMMUNE) 100 MG capsule Take 125-150 mg by mouth See admin instructions. 150 mg in morning and  125  mg at night   Yes [provider]  levothyroxine (SYNTHROID) 50 MCG tablet Take 1 tablet (50 mcg total) by mouth daily. 11/09/19 02/15/20 Yes [name], Jeffery Bloomer, MD  losartan (COZAAR) 50 MG tablet Take 50 mg by mouth in the morning and at bedtime.   Yes [provider]  polyethylene glycol (MIRALAX) 17 g packet Take 17 g by mouth daily as needed. 01/30/20  Yes Hall, Carole N, DO  predniSONE (DELTASONE) 5 MG tablet Take 5 mg by mouth.   Yes [provider]  traMADol (ULTRAM) 50 MG tablet Take 1 tablet (50 mg total) by mouth every 6 (six) hours as needed for moderate pain or severe pain. 01/30/20  Yes Norm Parcel, PA-C    Allergies  Allergen Reactions  . Diflucan [Fluconazole] Other (See Comments)    Incompatible with cyclosporine  . Contrast Media [Iodinated Diagnostic Agents] Rash    Pt tolerates benadryl pre scan without post scan complications   . Vancomycin Rash    Patient Active Problem List   Diagnosis Date Noted  . Acute acalculous cholecystitis 01/28/2020  . Normocytic anemia 01/28/2020  . Hypothyroidism 01/28/2020  . Benign essential HTN 01/28/2020  . 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 HTN (hypertension)   . Hypertension    Phreesia 01/05/2020  . Renal disease   . Status post radiation therapy 07/17/14-08/28/14   Tongue  . Tongue cancer [facility]    right    Past Surgical History:  Procedure Laterality Date  . CHOLECYSTECTOMY N/A 01/29/2020   Procedure: LAPAROSCOPIC CHOLECYSTECTOMY;  Surgeon: Coralie Keens, MD;  Location: [place];  Service: General;  Laterality: N/A;  . HIP SURGERY     bone transplant per pt to both hips  . JOINT REPLACEMENT N/A    Phreesia 01/05/2020  . KIDNEY TRANSPLANT  11/1998  . NECK DISSECTION Bilateral 05/23/14  . PARTIAL GLOSSECTOMY Bilateral 05/23/14   Dr. Conley Canal [facility]  . perm cath placed and removed    . SKIN GRAFT  05/23/14   split thickness skin graft neck poss  . submental flap  05/23/14  . TOTAL HIP ARTHROPLASTY Bilateral   . TRACHEOSTOMY  05/23/14    Social History   Socioeconomic History  . Marital status: Married    Spouse name: Not on file  . Number of children: 1  . Years of education: Not on file  . Highest education level: Not on file  Occupational History  . Not on file  Tobacco Use  . Smoking status: Never Smoker  . Smokeless tobacco: Former Systems developer    Types: Chew  Substance and Sexual Activity  . Alcohol use: Yes    Alcohol/week: 6.0 standard drinks    Types: 6 Glasses of wine per week  . Drug use:  No  . Sexual activity: Not on file  Other Topics Concern  . Not on file  Social History Narrative  . Not on file   Social Determinants of Health   Financial Resource Strain:   . Difficulty of Paying Living Expenses:   Food Insecurity:   . Worried About Charity fundraiser in the Last Year:   . Arboriculturist in the Last Year:   Transportation Needs:   . Film/video editor (Medical):   Marland Kitchen Lack of Transportation (Non-Medical):   Physical Activity:   . Days of Exercise per Week:   . Minutes of Exercise per Session:   Stress:   . Feeling of Stress :   Social Connections:   .  Frequency of Communication with Friends and Family:   . Frequency of Social Gatherings with Friends and Family:   . Attends Religious Services:   . Active Member of Clubs or Organizations:   . Attends Archivist Meetings:   Marland Kitchen Marital Status:   Intimate Partner Violence:   . Fear of Current or Ex-Partner:   . Emotionally Abused:   Marland Kitchen Physically Abused:   . Sexually Abused:     Family History  Problem Relation Age of Onset  . Thyroid disease Mother      Review of Systems  Constitutional: Negative.  Negative for chills and fever.  HENT: Negative.  Negative for congestion and sore throat.   Respiratory: Negative.  Negative for cough and shortness of breath.   Cardiovascular: Negative.  Negative for chest pain and palpitations.  Gastrointestinal: Negative for abdominal pain, blood in stool, diarrhea, melena, nausea and vomiting.  Genitourinary: Negative.  Negative for dysuria and hematuria.  Skin: Negative.  Negative for rash.  Neurological: Negative.  Negative for dizziness and headaches.  All other systems reviewed and are negative.  Today's Vitals   02/15/20 1109  BP: 117/69  Pulse: 70  Temp: 98.1 F (36.7 C)  TempSrc: Temporal  SpO2: 97%  Weight: 167 lb (75.8 kg)  Height: 6' (1.829 m)   Body mass index is 22.65 kg/m.   Physical Exam Vitals reviewed.  Constitutional:      Appearance: Normal appearance.  HENT:     Head: Normocephalic.  Eyes:     Extraocular Movements: Extraocular movements intact.     Pupils: Pupils are equal, round, and reactive to light.  Cardiovascular:     Rate and Rhythm: Normal rate and regular rhythm.     Pulses: Normal pulses.     Heart sounds: Normal heart sounds.  Pulmonary:     Effort: Pulmonary effort is normal.     Breath sounds: Normal breath sounds.  Abdominal:     Palpations: Abdomen is soft.     Tenderness: There is no abdominal tenderness.  Musculoskeletal:        General: Normal range of motion.     Cervical  back: Normal range of motion.  Skin:    General: Skin is warm and dry.  Neurological:     General: No focal deficit present.     Mental Status: He is alert and oriented to person, place, and time.  Psychiatric:        Mood and Affect: Mood normal.      ASSESSMENT & PLAN: Johnell was seen today for hospitalization follow-up.  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  If you have not heard from [place] then please contact us. The fastest way to get your results is to register for My Chart.   IF you received an x-ray today, you will receive an invoice from [facility]. Please contact [facility] at [phone] with questions or concerns regarding your invoice.   IF you received labwork today, you will receive an invoice from [facility]. Please contact [facility] at [phone] with questions or concerns regarding your invoice.   Our billing staff will not be able to assist you with questions regarding bills from these companies.  You will be contacted with the lab results as soon as they are available. The fastest way to get your results is to activate your My Chart account. Instructions are located on the last page of this paperwork. If you have not heard from [place] regarding the results in 2 weeks, please contact this office.      Health Maintenance, Male Adopting a healthy lifestyle and getting preventive care are important in promoting health and wellness. Ask your health care provider about:  The right schedule for you to have regular tests and exams.  Things you can do on your own to prevent  diseases and keep yourself healthy. What should I know about diet, weight, and exercise? Eat a healthy diet   Eat a diet that includes plenty of vegetables, fruits, low-fat dairy products, and lean protein.  Do not eat a lot of foods that are high in solid fats, added sugars, or sodium. Maintain a healthy weight Body mass index (BMI) is a measurement that can be used to identify possible weight problems. It estimates body fat based on height and weight. Your health care provider can help determine your BMI and help you achieve or maintain a healthy weight. Get regular exercise Get regular exercise. This is one of the most important things you can do for your health. Most adults should:  Exercise for at least 150 minutes each week. The exercise should increase your heart rate and make you sweat (moderate-intensity exercise).  Do strengthening exercises at least twice a week. This is in addition to the moderate-intensity exercise.  Spend less time sitting. Even light physical activity can be beneficial. Watch cholesterol and blood lipids Have your blood tested for lipids and cholesterol at 61 years of age, then have this test every 5 years. You may need to have your cholesterol levels checked more often if:  Your lipid or cholesterol levels are high.  You are older than 61 years of age.  You are at high risk for heart disease. What should I know about cancer screening? Many types of cancers can be detected early and may often be prevented. Depending on your health history and family history, you may need to have cancer screening at various ages. This may include screening for:  Colorectal cancer.  Prostate cancer.  Skin cancer.  Lung cancer. What should I know about heart disease, diabetes, and high blood pressure? Blood pressure and heart disease  High blood pressure causes heart disease and increases the risk of stroke. This is more likely to develop in people who have high  blood pressure readings, are of African descent, or are overweight.  Talk with your health care provider about your  target blood pressure readings.  Have your blood pressure checked: ? Every 3-5 years if you are 85-50 years of age. ? Every year if you are 35 years old or older.  If you are between the ages of 63 and 74 and are a current or former smoker, ask your health care provider if you should have a one-time screening for abdominal aortic aneurysm (AAA). Diabetes Have regular diabetes screenings. This checks your fasting blood sugar level. Have the screening done:  Once every three years after age 31 if you are at a normal weight and have a low risk for diabetes.  More often and at a younger age if you are overweight or have a high risk for diabetes. What should I know about preventing infection? Hepatitis B If you have a higher risk for hepatitis B, you should be screened for this virus. Talk with your health care provider to find out if you are at risk for hepatitis B infection. Hepatitis C Blood testing is recommended for:  Everyone born from [birth year] through 1967.  Anyone with known risk factors for hepatitis C. Sexually transmitted infections (STIs)  You should be screened each year for STIs, including gonorrhea and chlamydia, if: ? You are sexually active and are younger than 61 years of age. ? You are older than 61 years of age and your health care provider tells you that you are at risk for this type of infection. ? Your sexual activity has changed since you were last screened, and you are at increased risk for chlamydia or gonorrhea. Ask your health care provider if you are at risk.  Ask your health care provider about whether you are at high risk for HIV. Your health care provider may recommend a prescription medicine to help prevent HIV infection. If you choose to take medicine to prevent HIV, you should first get tested for HIV. You should then be tested every 3 months for  as long as you are taking the medicine. Follow these instructions at home: Lifestyle  Do not use any products that contain nicotine or tobacco, such as cigarettes, e-cigarettes, and chewing tobacco. If you need help quitting, ask your health care provider.  Do not use street drugs.  Do not share needles.  Ask your health care provider for help if you need support or information about quitting drugs. Alcohol use  Do not drink alcohol if your health care provider tells you not to drink.  If you drink alcohol: ? Limit how much you have to 0-2 drinks a day. ? Be aware of how much alcohol is in your drink. In the [place] one drink equals one 12 oz bottle of beer (355 mL), one 5 oz glass of wine (148 mL), or one 1 oz glass of hard liquor (44 mL). General instructions  Schedule regular health, dental, and eye exams.  Stay current with your vaccines.  Tell your health care provider if: ? You often feel depressed. ? You have ever been abused or do not feel safe at home. Summary  Adopting a healthy lifestyle and getting preventive care are important in promoting health and wellness.  Follow your health care provider's instructions about healthy diet, exercising, and getting tested or screened for diseases.  Follow your health care provider's instructions on monitoring your cholesterol and blood pressure. This information is not intended to replace advice given to you by your health care provider. Make sure you discuss any questions you have with your health care provider. Document Revised:  06/16/2018 Document Reviewed: 06/16/2018 Elsevier Patient Education  2020 Elsevier Inc.      Agustina Caroli, MD [facility]

## 2020-05-21 ENCOUNTER — Ambulatory Visit: Payer: 59 | Admitting: Emergency Medicine

## 2020-08-15 ENCOUNTER — Ambulatory Visit: Payer: 59 | Admitting: Emergency Medicine

## 2021-04-12 IMAGING — US US ABDOMEN LIMITED
1 series · 14 of 25 positions shown · non-contrast
Comparison: CT earlier today.

CLINICAL DATA: Possible cholecystitis.

EXAM:
ULTRASOUND ABDOMEN LIMITED RIGHT UPPER QUADRANT

[Series 1: us abdomen limited ruq · 14 of 38 slices shown]
[im 1/38]
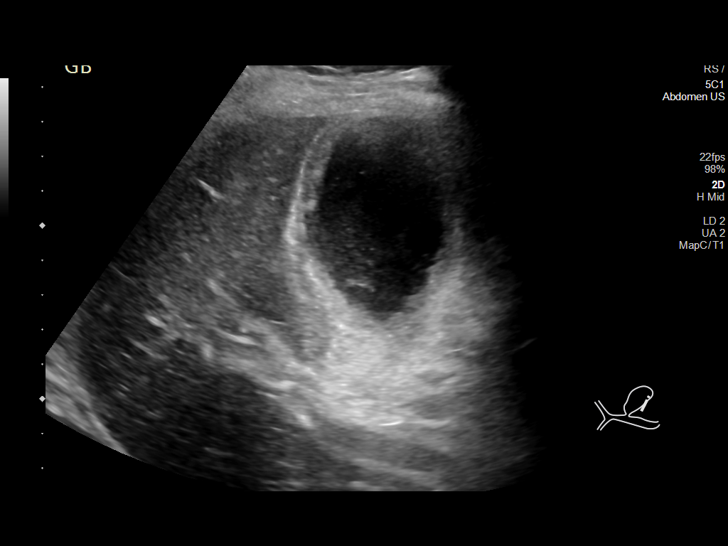
[im 4/38]
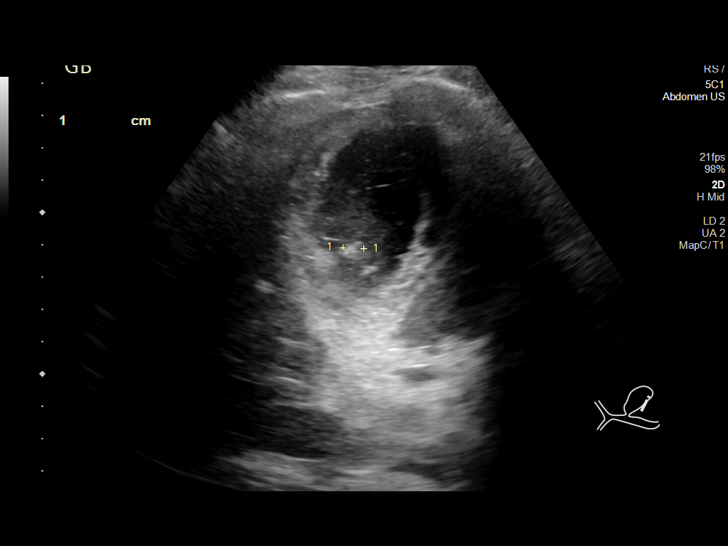
[im 7/38]
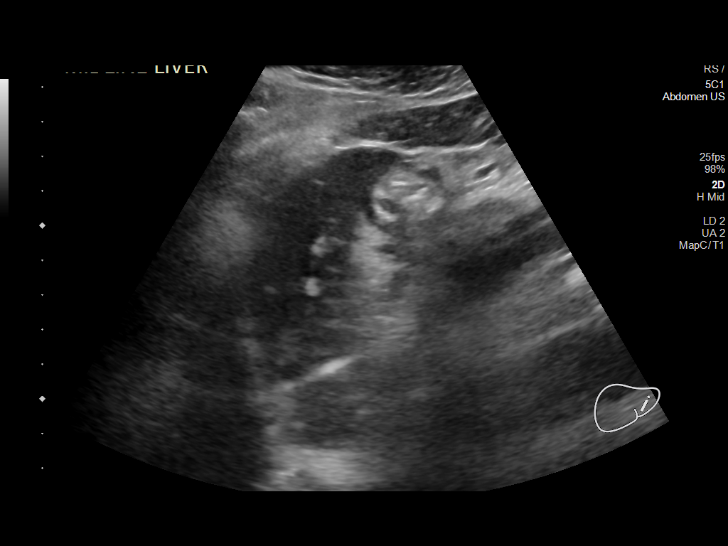
[im 10/38]
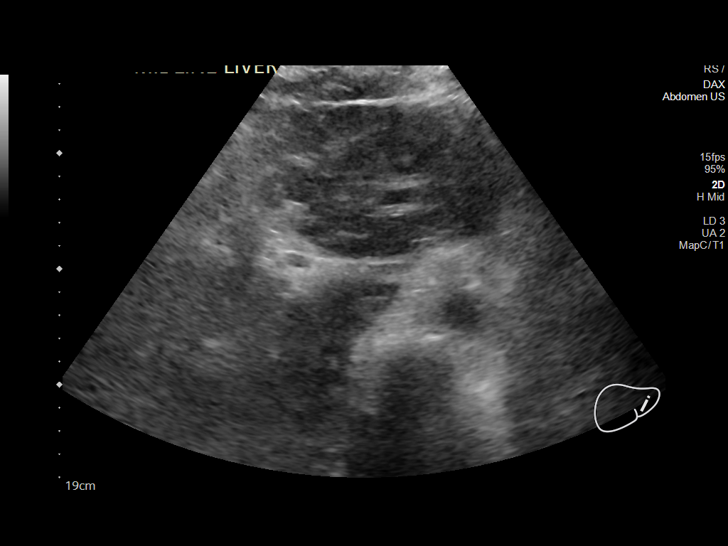
[im 13/38]
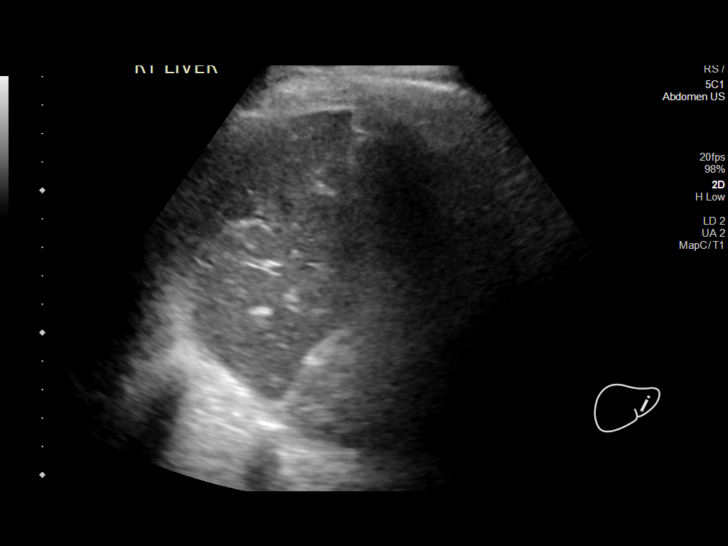
[im 14/38]
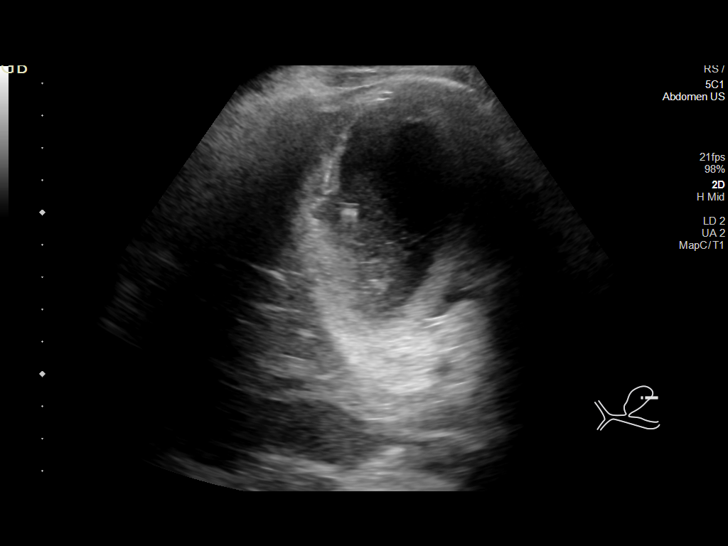
[im 17/38]
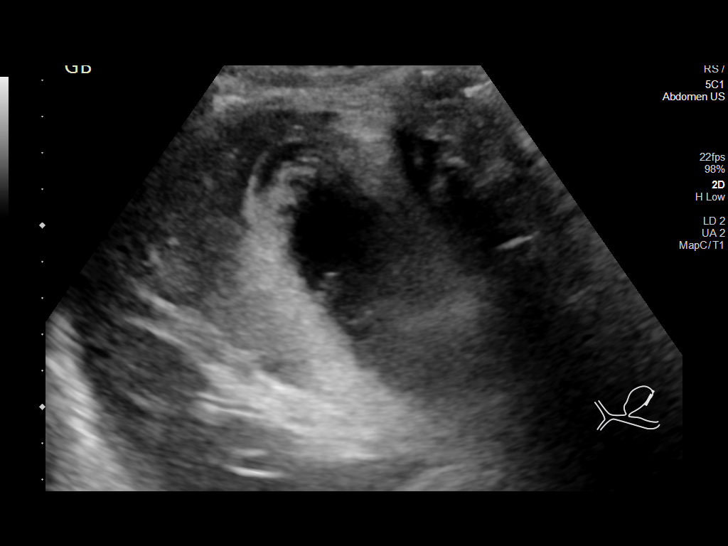
[im 21/38]
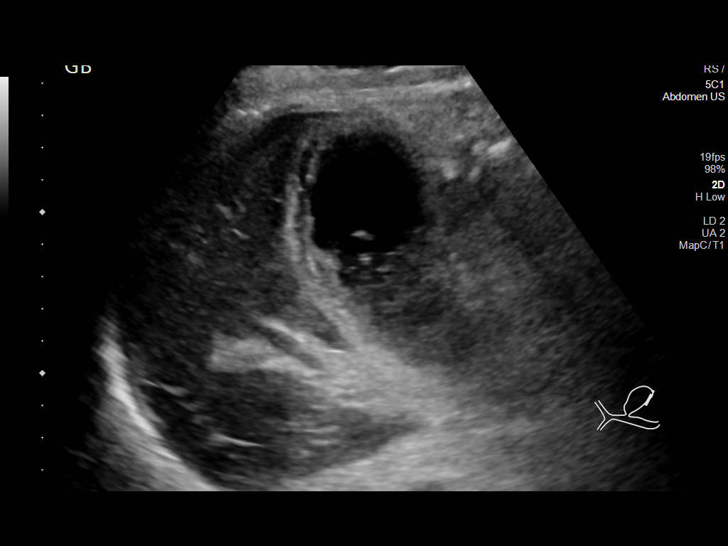
[im 24/38]
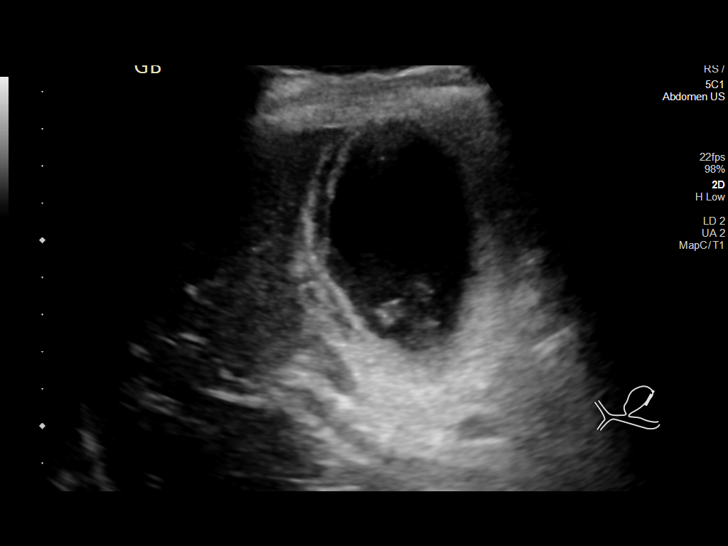
[im 25/38]
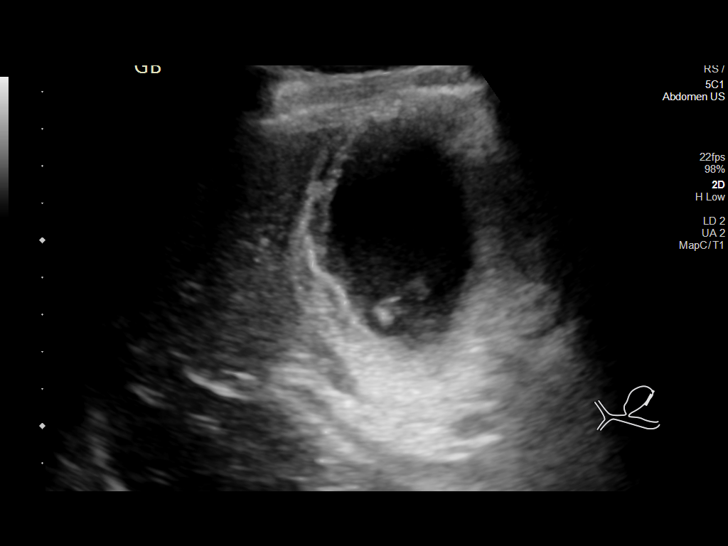
[im 28/38]
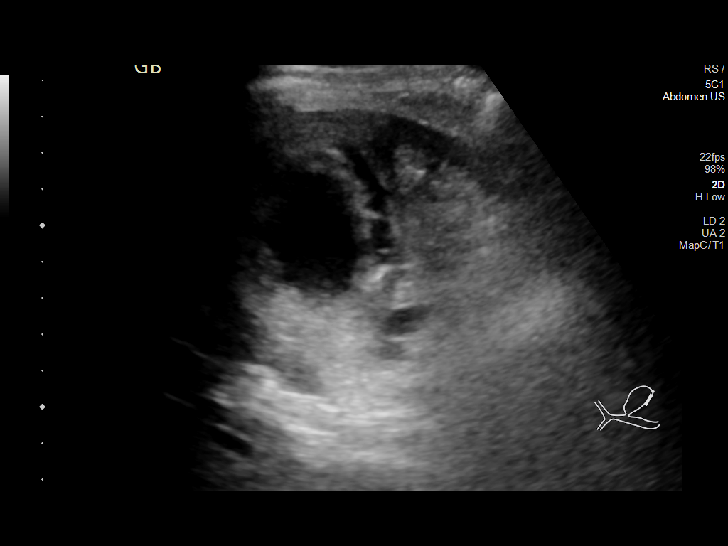
[im 31/38]
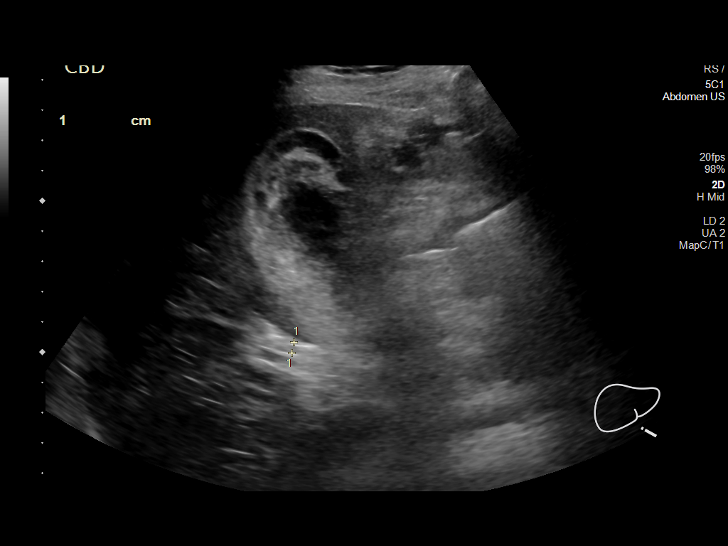
[im 34/38]
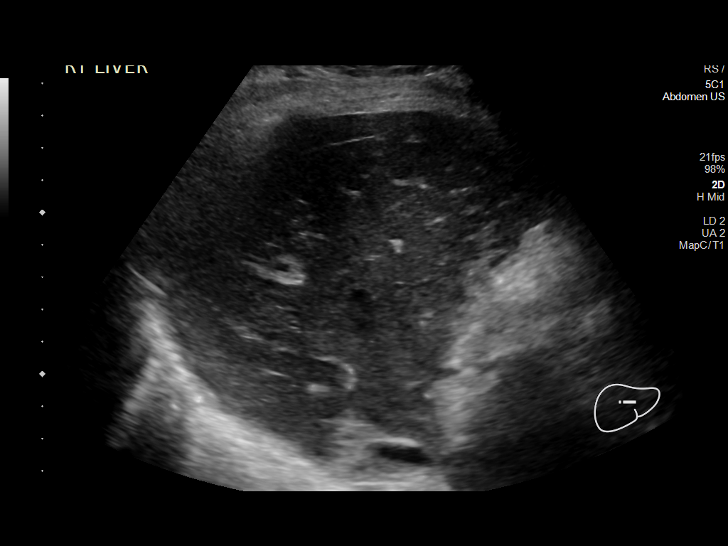
[im 38/38]
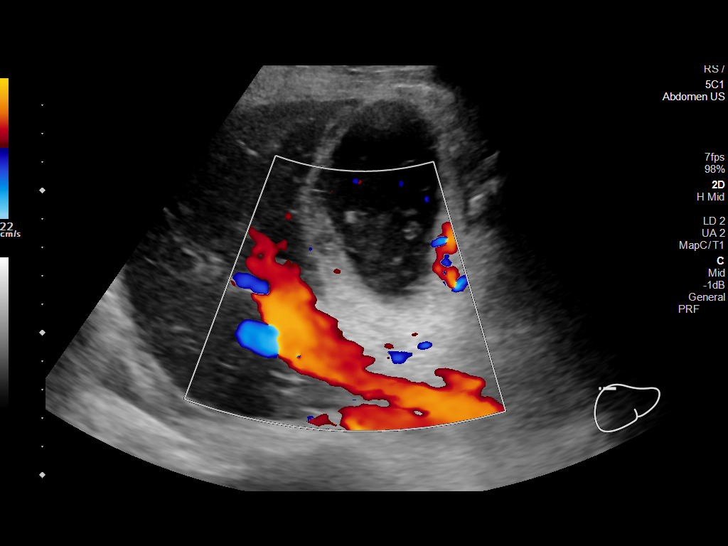

[14 of 25 positions shown; findings below may reference images not displayed]

FINDINGS: Exam difficult due to patient body habitus.

Gallbladder:

Moderate cholelithiasis and sludge with largest stone measuring 6
mm. Negative sonographic Murphy sign. Small amount of
pericholecystic fluid is present. Edematous gallbladder wall
thickening measuring 9 mm.

Common bile duct:

Diameter: 3.6 mm.

Liver:

No focal lesion identified. Within normal limits in parenchymal
echogenicity. Portal vein is patent on color Doppler imaging with
normal direction of blood flow towards the liver.

Other: None.
IMPRESSION: Moderate cholelithiasis and sludge as well as moderate edematous
gallbladder wall thickening and minimal adjacent free fluid as
findings are compatible with acute cholecystitis.

## 2021-04-13 IMAGING — US US RENAL TRANSPLANT
1 series · 14 of 25 positions shown · non-contrast
Comparison: CTA from previous day

CLINICAL DATA: Acute kidney injury.  Kidney transplant 3999.

EXAM:
ULTRASOUND OF RENAL TRANSPLANT WITH RENAL DOPPLER ULTRASOUND
TECHNIQUE: Ultrasound examination of the renal transplant was performed with
gray-scale, color and duplex doppler evaluation.

[Series 1: us renal transplant w/doppler · 14 of 50 slices shown]
[im 1/50]
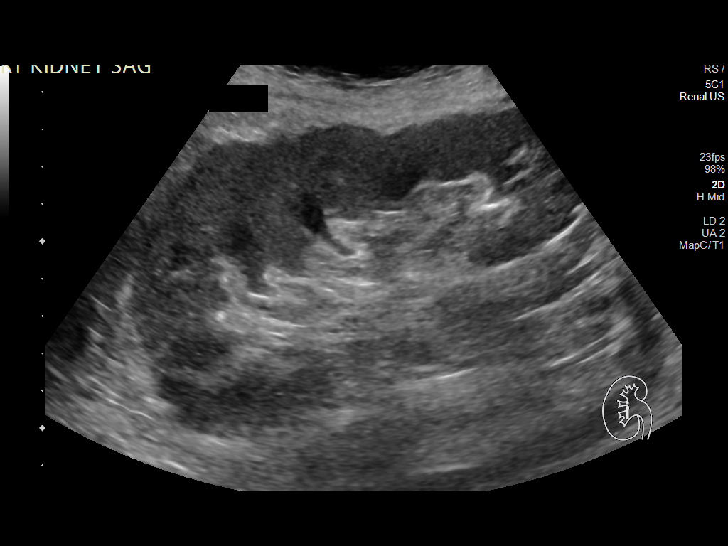
[im 5/50]
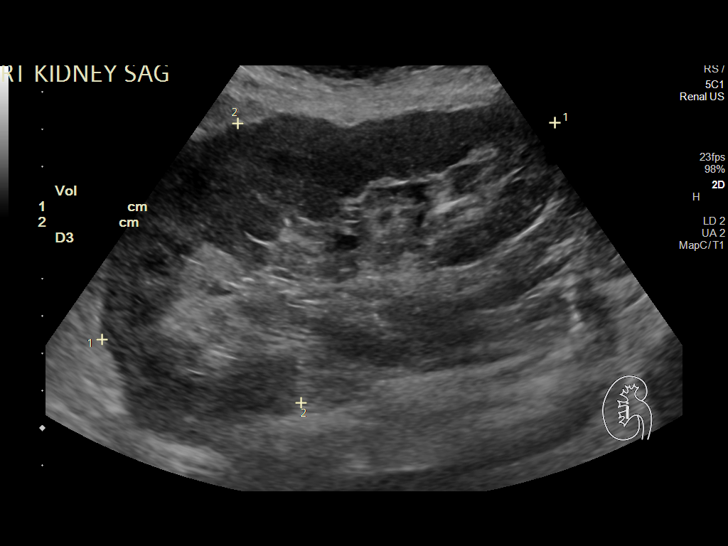
[im 9/50]
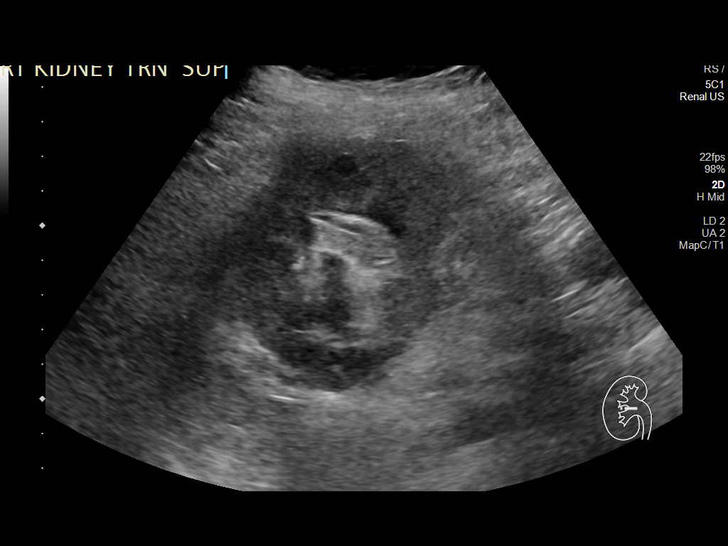
[im 13/50]
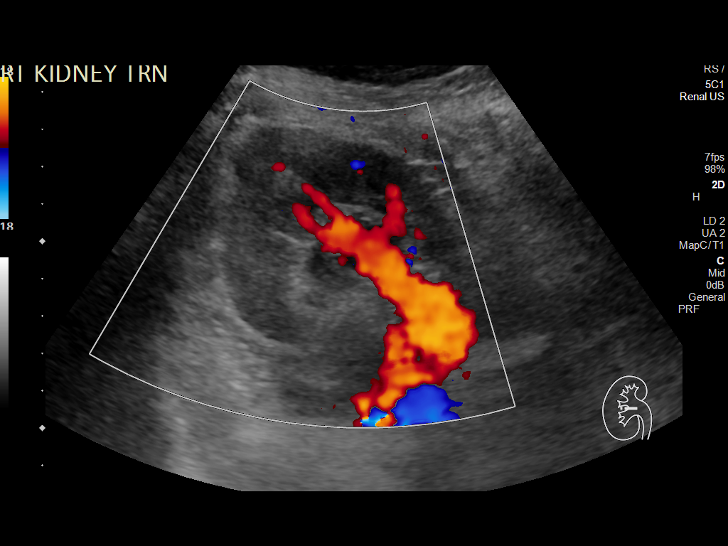
[im 17/50]
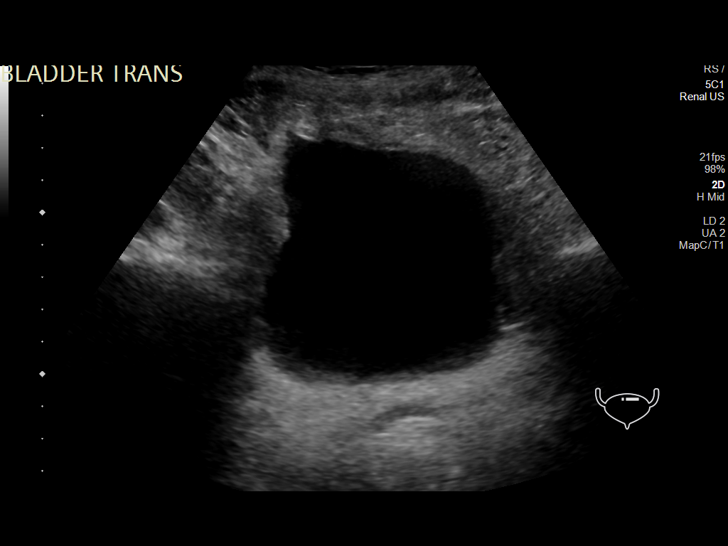
[im 19/50]
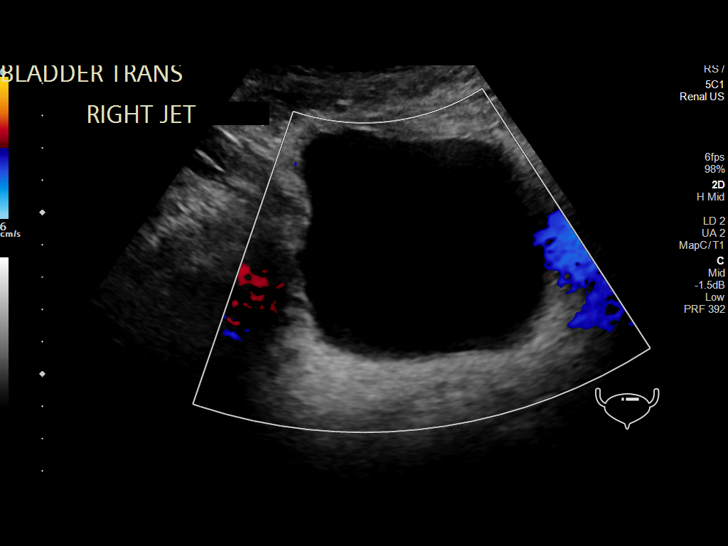
[im 23/50]
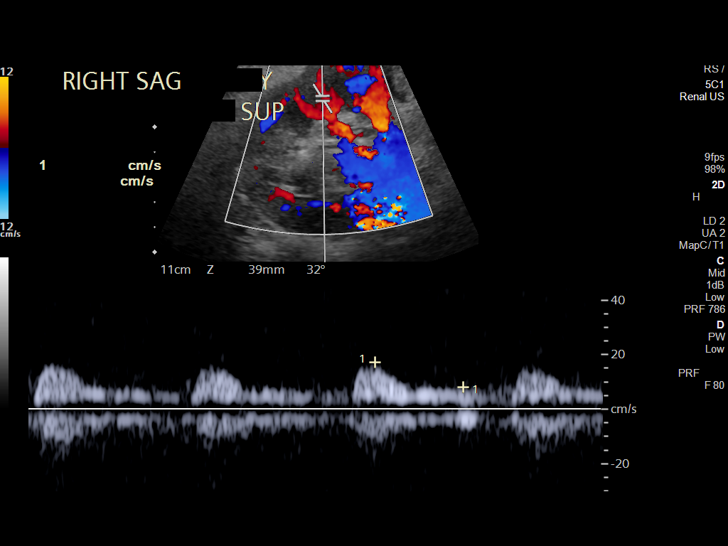
[im 27/50]
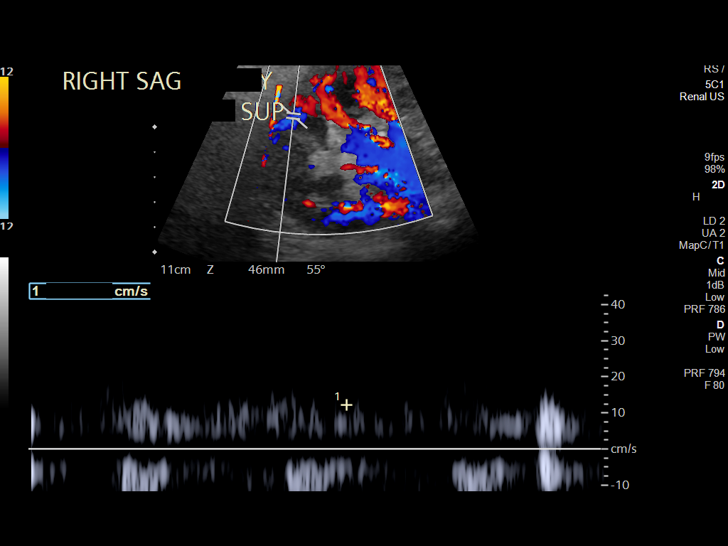
[im 31/50]
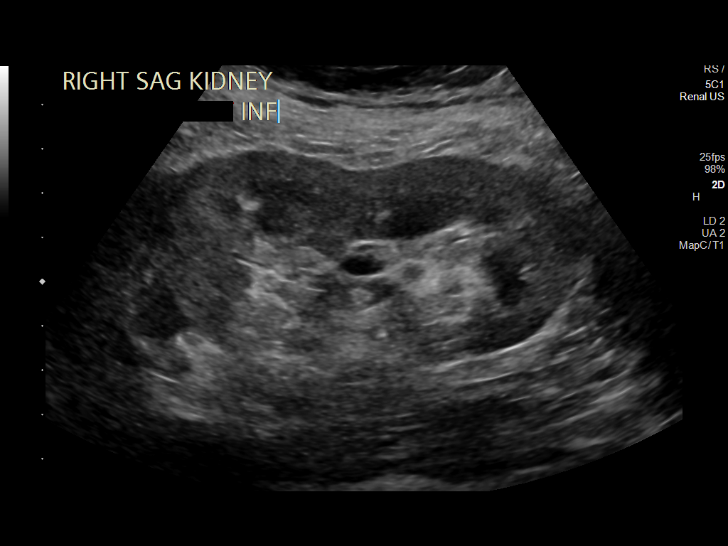
[im 33/50]
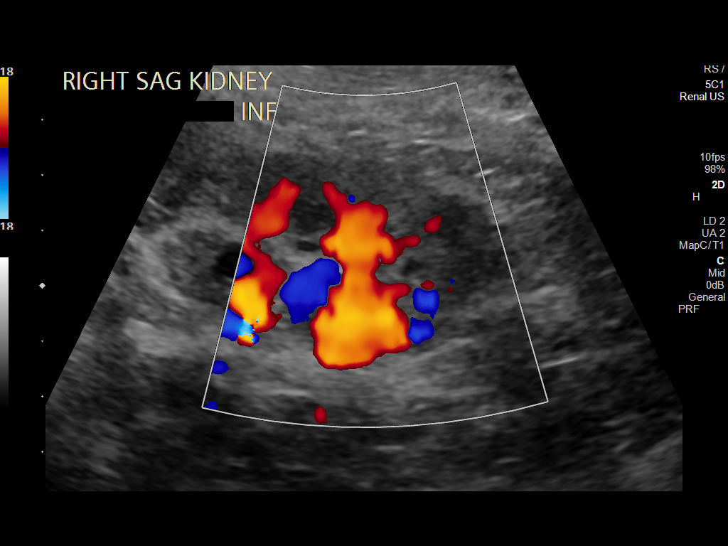
[im 37/50]
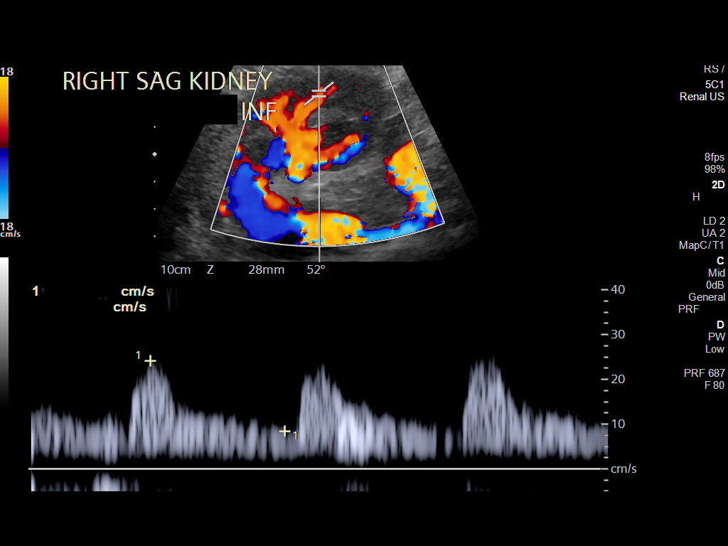
[im 41/50]
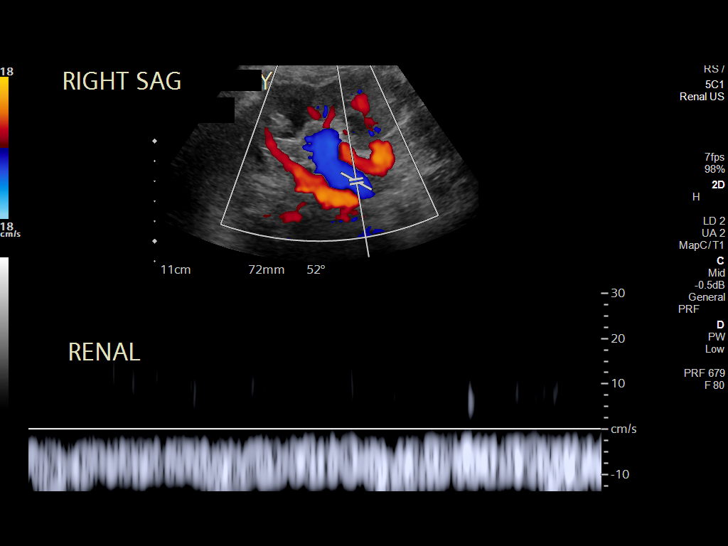
[im 45/50]
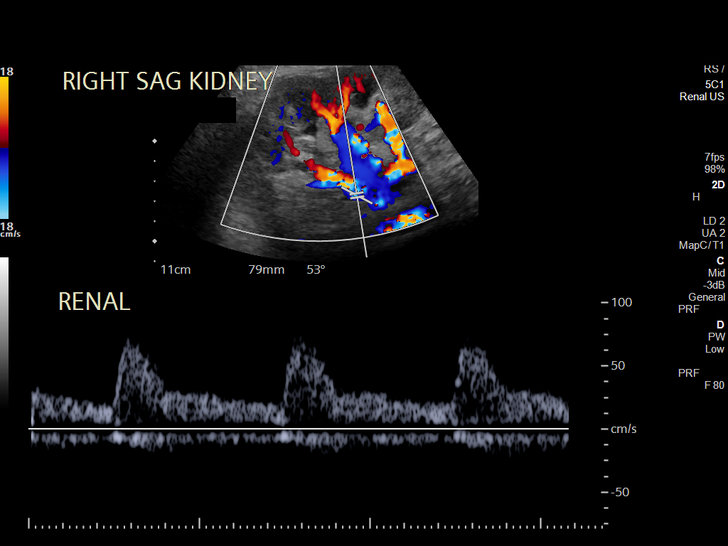
[im 50/50]
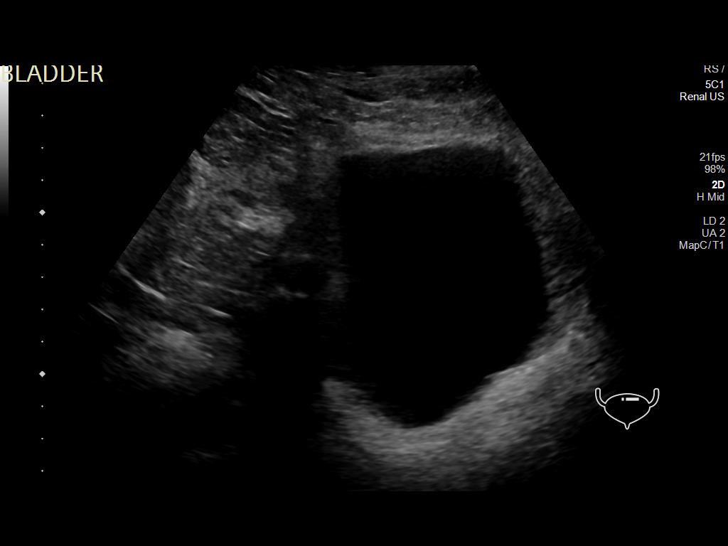

[14 of 25 positions shown; findings below may reference images not displayed]

FINDINGS: Transplant kidney location: RLQ

Transplant Kidney:

Renal measurements: 13.4 x 7.7 x 6.3 = volume: 342mL. Normal in size
and parenchymal echogenicity. No evidence of mass or hydronephrosis.
No peri-transplant fluid collection seen.

Color flow in the main renal artery:  Present

Color flow in the main renal vein:  Present

Duplex Doppler Evaluation:

Main Renal Artery Resistive Index:

Venous waveform in main renal vein:  Present

Intrarenal resistive index in upper pole:

(normal 0.6-0.8; equivocal 0.8-0.9; abnormal >= 0.9)

Intrarenal resistive index in lower pole:

(normal 0.6-0.8; equivocal 0.8-0.9; abnormal >= 0.9)

Bladder: Normal for degree of bladder distention.

Other findings:  Ureteral jets not visualized.
IMPRESSION: Normal transplant renal vascular Doppler evaluation.

No hydronephrosis.
